# Patient Record
Sex: Male | Born: 1965 | Hispanic: Refuse to answer | Marital: Married | Smoking: Never smoker
Health system: Southern US, Community
[De-identification: ages and names within clinical notes are randomized; demographics above are authoritative.]

## PROBLEM LIST (undated history)

## (undated) DIAGNOSIS — S8290XA Unspecified fracture of unspecified lower leg, initial encounter for closed fracture: Secondary | ICD-10-CM

## (undated) DIAGNOSIS — IMO0002 Reserved for concepts with insufficient information to code with codable children: Secondary | ICD-10-CM

## (undated) DIAGNOSIS — F40240 Claustrophobia: Secondary | ICD-10-CM

## (undated) DIAGNOSIS — F988 Other specified behavioral and emotional disorders with onset usually occurring in childhood and adolescence: Secondary | ICD-10-CM

## (undated) DIAGNOSIS — S5290XA Unspecified fracture of unspecified forearm, initial encounter for closed fracture: Secondary | ICD-10-CM

## (undated) DIAGNOSIS — T7840XA Allergy, unspecified, initial encounter: Secondary | ICD-10-CM

## (undated) DIAGNOSIS — B019 Varicella without complication: Secondary | ICD-10-CM

## (undated) DIAGNOSIS — F32A Depression, unspecified: Secondary | ICD-10-CM

## (undated) DIAGNOSIS — F329 Major depressive disorder, single episode, unspecified: Secondary | ICD-10-CM

## (undated) HISTORY — DX: Reserved for concepts with insufficient information to code with codable children: IMO0002

## (undated) HISTORY — PX: INGUINAL HERNIA REPAIR: SUR1180

## (undated) HISTORY — DX: Unspecified fracture of unspecified forearm, initial encounter for closed fracture: S52.90XA

## (undated) HISTORY — DX: Unspecified fracture of unspecified lower leg, initial encounter for closed fracture: S82.90XA

## (undated) HISTORY — PX: TOOTH EXTRACTION: SUR596

## (undated) HISTORY — DX: Claustrophobia: F40.240

## (undated) HISTORY — DX: Allergy, unspecified, initial encounter: T78.40XA

## (undated) HISTORY — DX: Depression, unspecified: F32.A

## (undated) HISTORY — DX: Varicella without complication: B01.9

## (undated) HISTORY — DX: Major depressive disorder, single episode, unspecified: F32.9

## (undated) HISTORY — DX: Other specified behavioral and emotional disorders with onset usually occurring in childhood and adolescence: F98.8

---
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
## 2014-08-15 ENCOUNTER — Encounter: Payer: Self-pay | Admitting: Internal Medicine

## 2014-08-15 ENCOUNTER — Encounter (INDEPENDENT_AMBULATORY_CARE_PROVIDER_SITE_OTHER): Payer: Self-pay

## 2014-08-15 ENCOUNTER — Ambulatory Visit (INDEPENDENT_AMBULATORY_CARE_PROVIDER_SITE_OTHER): Payer: 59 | Admitting: Internal Medicine

## 2014-08-15 VITALS — BP 118/84 | HR 87 | Temp 98.4°F | Ht 68.66 in | Wt 177.0 lb

## 2014-08-15 DIAGNOSIS — F419 Anxiety disorder, unspecified: Secondary | ICD-10-CM | POA: Insufficient documentation

## 2014-08-15 DIAGNOSIS — F329 Major depressive disorder, single episode, unspecified: Secondary | ICD-10-CM

## 2014-08-15 DIAGNOSIS — R41840 Attention and concentration deficit: Secondary | ICD-10-CM

## 2014-08-15 DIAGNOSIS — F3289 Other specified depressive episodes: Secondary | ICD-10-CM

## 2014-08-15 DIAGNOSIS — F32A Depression, unspecified: Secondary | ICD-10-CM

## 2014-08-15 DIAGNOSIS — G4700 Insomnia, unspecified: Secondary | ICD-10-CM

## 2014-08-15 DIAGNOSIS — N529 Male erectile dysfunction, unspecified: Secondary | ICD-10-CM

## 2014-08-15 NOTE — Patient Instructions (Addendum)

## 2014-08-15 NOTE — Progress Notes (Signed)
HPI Pt presents to the clinic today to establish care. He recently moved from [facility] with his partner. He does have some concerns today about ED. This has just started over the last 1-2 months. He has never had issues with this is the past. He has no history of low testosterone or prostate issues. He is not being treated for diabetes or HTN. He thinks it may be related to stress from the move. He has not tried anything OTC.  Flu: never Tetanus: < 10 years Eye Doctor: as needed Dentist: biannually  Past Medical History  Diagnosis Date  . Chicken pox   . Depression   . Allergy     Current Outpatient Prescriptions  Medication Sig Dispense Refill  . ALPRAZolam (XANAX) 0.5 MG tablet Take 0.025 mg by mouth 2 (two) times daily as needed for anxiety.      Marland Kitchen amphetamine-dextroamphetamine (ADDERALL) 20 MG tablet Take 20 mg by mouth daily.      Marland Kitchen zolpidem (AMBIEN) 10 MG tablet Take 10 mg by mouth at bedtime as needed for sleep.       No current facility-administered medications for this visit.    Allergies  Allergen Reactions  . Penicillins Shortness Of Breath    Family History  Problem Relation Age of Onset  . Asthma Mother   . Migraines Mother   . Cancer Father     Prostate  . Epilepsy Father   . Cancer Paternal Grandmother     Colon  . Stroke Paternal Grandmother   . Heart disease Paternal Grandfather     History   Social History  . Marital Status: Single    Spouse Name: N/A    Number of Children: N/A  . Years of Education: N/A   Occupational History  . Not on file.   Social History Main Topics  . Smoking status: Never Smoker   . Smokeless tobacco: Never Used  . Alcohol Use: 1.8 oz/week    3 Glasses of wine per week     Comment: occasional  . Drug Use: No  . Sexual Activity: Yes   Other Topics Concern  . Not on file   Social History Narrative  . No narrative on file    ROS:  Constitutional: Denies fever, malaise, fatigue, headache or abrupt weight changes.   Respiratory: Denies difficulty breathing, shortness of breath, cough or sputum production.   Cardiovascular: Denies chest pain, chest tightness, palpitations or swelling in the hands or feet.  GU: Denies frequency, urgency, pain with urination, blood in urine, odor or discharge. Neurological: Denies dizziness, difficulty with memory, difficulty with speech or problems with balance and coordination.   No other specific complaints in a complete review of systems (except as listed in HPI above).  PE:  BP 118/84  Pulse 87  Temp(Src) 98.4 F (36.9 C) (Oral)  Ht 5' 8.66" (1.744 m)  Wt 177 lb (80.287 kg)  BMI 26.40 kg/m2  SpO2 98% Wt Readings from Last 3 Encounters:  08/15/14 177 lb (80.287 kg)    General: Appears  His stated age, well developed, well nourished in NAD. Cardiovascular: Normal rate and rhythm. S1,S2 noted.  No murmur, rubs or gallops noted.  Pulmonary/Chest: Normal effort and positive vesicular breath sounds. No respiratory distress. No wheezes, rales or ronchi noted.  Psychiatric: Mood and affect normal. Behavior is normal. Judgment and thought content normal.    Assessment and Plan:

## 2014-08-15 NOTE — Progress Notes (Signed)
Pre visit review using our clinic review tool, if applicable. No additional management support is needed unless otherwise documented below in the visit note. 

## 2014-08-15 NOTE — Assessment & Plan Note (Signed)
Has not been diagnosed with ADD Has noted improvement with Adderall No need to request records at this time

## 2014-08-15 NOTE — Assessment & Plan Note (Signed)
Likely stress related Discussed possibility of checking testosterone Discussed treatment with medication He prefers to hold off for now and continue to monitor symptoms

## 2014-08-15 NOTE — Assessment & Plan Note (Signed)
Rare xanax use.  

## 2014-08-15 NOTE — Assessment & Plan Note (Signed)
R/T altering work schedule [employer] when needed

## 2014-09-23 ENCOUNTER — Other Ambulatory Visit: Payer: Self-pay

## 2014-09-23 NOTE — Telephone Encounter (Signed)
Pt left v/m requesting rx for adderall and alprazolam. Call when ready for pick up. Not sure if Nicki Reaper NP has written before.Please advise.

## 2014-09-24 MED ORDER — ALPRAZOLAM 0.5 MG PO TABS: 0.0250 mg | ORAL_TABLET | Freq: Two times a day (BID) | ORAL | Status: DC | PRN

## 2014-09-24 NOTE — Telephone Encounter (Signed)
Spoke to pt and advised per Dr Dayton Martes. Pt verbally expressed understanding; Xanax called in to requested pharmacy

## 2014-09-24 NOTE — Telephone Encounter (Signed)
Pt called to check on the status of this request  [phone]

## 2014-09-24 NOTE — Telephone Encounter (Signed)
Lm on pts vm requesting a call back 

## 2014-09-24 NOTE — Telephone Encounter (Signed)
Reviewed request again- has never been prescribed by [facility] and has not been diagnosed with ADD.  I am not comfortable with refilling his adderall.  Ok to refill alprazolam but adderall will have to wait until Rene Kocher returns from vacation.

## 2014-09-24 NOTE — Telephone Encounter (Signed)
It looks like he has not been diagnosed with ADD but she felt refill was appropriate.  Ok to refill both one time only.

## 2014-09-30 ENCOUNTER — Other Ambulatory Visit: Payer: Self-pay

## 2014-09-30 NOTE — Telephone Encounter (Signed)
Pt left v/m requesting rx for Adderall. Call when ready for pick up.  

## 2014-10-01 MED ORDER — AMPHETAMINE-DEXTROAMPHETAMINE 20 MG PO TABS: 20.0000 mg | ORAL_TABLET | Freq: Every day | ORAL | Status: DC

## 2014-10-01 NOTE — Telephone Encounter (Signed)
RX printed and signed and given to Melanie 

## 2014-10-02 NOTE — Telephone Encounter (Signed)
Pt left v/m requesting status of Adderall rx. Pt request cb.

## 2014-10-03 NOTE — Telephone Encounter (Signed)
Left detailed msg on VM per HIPAA letting pt know Rx is ready for pick up 

## 2014-10-22 ENCOUNTER — Other Ambulatory Visit: Payer: Self-pay | Admitting: Internal Medicine

## 2014-10-23 NOTE — Telephone Encounter (Signed)
Rx called in to pharmacy. 

## 2014-10-23 NOTE — Telephone Encounter (Signed)
Pt left v/m requesting cb with status of ambien refill; [facility] said [facility]14.

## 2014-10-23 NOTE — Telephone Encounter (Signed)
This is the first request I have seen. Ok to phone in [place]

## 2014-10-25 ENCOUNTER — Other Ambulatory Visit: Payer: Self-pay | Admitting: Internal Medicine

## 2014-10-25 NOTE — Telephone Encounter (Signed)
Last filled 09/24/2014--please advise

## 2014-10-27 NOTE — Telephone Encounter (Signed)
I want PCP input on this.  Thanks.

## 2014-10-28 NOTE — Telephone Encounter (Signed)
Rx called in to pharmacy. 

## 2014-10-28 NOTE — Telephone Encounter (Signed)
Ok to phone in xanax 

## 2014-11-21 ENCOUNTER — Other Ambulatory Visit: Payer: Self-pay

## 2014-11-21 MED ORDER — AMPHETAMINE-DEXTROAMPHETAMINE 20 MG PO TABS: 20.0000 mg | ORAL_TABLET | Freq: Every day | ORAL | Status: DC

## 2014-11-21 NOTE — Telephone Encounter (Signed)
RX printed and signed and given to Mel

## 2014-11-21 NOTE — Telephone Encounter (Signed)
Pt left v/m requesting rx for Adderall. Call when ready for pick up. Pt last seen 08/15/14.

## 2014-11-22 NOTE — Telephone Encounter (Signed)
Rx left in front office for pick up and left detailed msg on VM per HIPAA

## 2014-12-10 ENCOUNTER — Other Ambulatory Visit: Payer: Self-pay | Admitting: Internal Medicine

## 2014-12-11 NOTE — Telephone Encounter (Signed)
Ok to fill. Will need  CSA and UDS.

## 2014-12-11 NOTE — Telephone Encounter (Signed)
Last filled 10/28/14--please advise

## 2014-12-13 NOTE — Telephone Encounter (Signed)
Called pt left detailed msg per HIPAA  requesting him to come in to sign controlled substance contract before i can call in the Xanax--i asked pt if he could stop by the desk upon completion of CSA to let me know so that i can call in Rx

## 2014-12-16 ENCOUNTER — Encounter: Payer: Self-pay | Admitting: Internal Medicine

## 2014-12-16 ENCOUNTER — Telehealth: Payer: Self-pay | Admitting: Internal Medicine

## 2014-12-16 MED ORDER — ALPRAZOLAM 0.25 MG PO TABS: ORAL_TABLET | ORAL | Status: DC

## 2014-12-16 NOTE — Telephone Encounter (Signed)
Rx called in to pharmacy. 

## 2014-12-16 NOTE — Telephone Encounter (Signed)
Pt has completed his controlled substance contract and has left a sample in the lab. Pt said that there was a prescription that needed to be called in once this is complete.

## 2014-12-16 NOTE — Telephone Encounter (Signed)
Pt completed UDS---Rx called into pharmacy

## 2014-12-16 NOTE — Addendum Note (Signed)
Addended by: Roena Malady on: 12/16/2014 04:39 PM   Modules accepted: Orders

## 2014-12-30 ENCOUNTER — Other Ambulatory Visit: Payer: Self-pay | Admitting: *Deleted

## 2014-12-30 MED ORDER — AMPHETAMINE-DEXTROAMPHETAMINE 20 MG PO TABS: 20.0000 mg | ORAL_TABLET | Freq: Every day | ORAL | Status: DC

## 2014-12-30 NOTE — Telephone Encounter (Signed)
RX printed and signed, put in [facility] inbox

## 2014-12-30 NOTE — Telephone Encounter (Signed)
Pt left message on vm for a refill of adderall. He also said that his pharmacy states our office never responded to their request for alprazolam.

## 2014-12-31 ENCOUNTER — Encounter: Payer: Self-pay | Admitting: Internal Medicine

## 2014-12-31 MED ORDER — ALPRAZOLAM 0.25 MG PO TABS: ORAL_TABLET | ORAL | Status: DC

## 2014-12-31 NOTE — Telephone Encounter (Signed)
Pt is aware of Rx pick up--however pt [facility]--was documented Rx called in 12/16/14--[facility] 11/15--called in Xanax

## 2014-12-31 NOTE — Addendum Note (Signed)
Addended by: Roena Malady on: 12/31/2014 04:53 PM   Modules accepted: Orders

## 2015-01-14 ENCOUNTER — Other Ambulatory Visit: Payer: Self-pay | Admitting: Internal Medicine

## 2015-01-14 ENCOUNTER — Encounter: Payer: 59 | Admitting: Internal Medicine

## 2015-01-21 ENCOUNTER — Encounter: Payer: Self-pay | Admitting: Family Medicine

## 2015-01-21 ENCOUNTER — Ambulatory Visit (INDEPENDENT_AMBULATORY_CARE_PROVIDER_SITE_OTHER): Payer: 59 | Admitting: Family Medicine

## 2015-01-21 VITALS — BP 104/84 | HR 87 | Temp 98.9°F | Wt 185.2 lb

## 2015-01-21 DIAGNOSIS — R41840 Attention and concentration deficit: Secondary | ICD-10-CM

## 2015-01-21 DIAGNOSIS — Z7189 Other specified counseling: Secondary | ICD-10-CM

## 2015-01-21 DIAGNOSIS — Z0000 Encounter for general adult medical examination without abnormal findings: Secondary | ICD-10-CM

## 2015-01-21 DIAGNOSIS — Z1211 Encounter for screening for malignant neoplasm of colon: Secondary | ICD-10-CM

## 2015-01-21 DIAGNOSIS — Z125 Encounter for screening for malignant neoplasm of prostate: Secondary | ICD-10-CM

## 2015-01-21 DIAGNOSIS — G4700 Insomnia, unspecified: Secondary | ICD-10-CM

## 2015-01-21 LAB — PSA: PSA: 0.84 ng/mL (ref 0.10–4.00)

## 2015-01-21 MED ORDER — AMPHETAMINE-DEXTROAMPHETAMINE 20 MG PO TABS: 20.0000 mg | ORAL_TABLET | Freq: Every day | ORAL | Status: DC

## 2015-01-21 NOTE — Progress Notes (Signed)
Pre visit review using our clinic review tool, if applicable. No additional management support is needed unless otherwise documented below in the visit note.  CPE- See plan.  Routine anticipatory guidance given to patient.  See health maintenance. Tetanus likely up to date, requesting records.  Flu shot d/w pt.  PSA pending, FH noted.  D/w patient [id number] for colon cancer screening, including IFOB vs. colonoscopy.  Risks and benefits of both were discussed and patient voiced understanding.  Pt elects QQP:YPPJ.  Living will- husband [name] designated if patient were incapacitated.  Diet and exercise d/w pt, encouraged.  He is looking for work.    ADD.  H/o low mood (after end of prev relationship) and concentration difficulty, prev rx'd adderall by prior MD.  Good effect on med.  No ADE.  Compliant.  No SI/HI.   H/o insomnia and h/o claustrophobia.  Taking xanax prn, not daily.  No ADE on med.  +effect when used prn.    PMH and SH reviewed  Meds, vitals, and allergies reviewed.   ROS: See HPI.  Otherwise negative.    GEN: nad, alert and oriented HEENT: mucous membranes moist NECK: supple w/o LA CV: rrr. PULM: ctab, no inc wob ABD: soft, +bs EXT: no edema SKIN: no acute rash

## 2015-01-21 NOTE — Patient Instructions (Signed)
Go to the lab on the way out.  We'll contact you with your lab report. I'll get your old records in the meantime.  Take care.  Glad to see you.

## 2015-01-23 ENCOUNTER — Encounter: Payer: Self-pay | Admitting: Family Medicine

## 2015-01-23 DIAGNOSIS — Z7189 Other specified counseling: Secondary | ICD-10-CM | POA: Insufficient documentation

## 2015-01-23 DIAGNOSIS — Z0000 Encounter for general adult medical examination without abnormal findings: Secondary | ICD-10-CM | POA: Insufficient documentation

## 2015-01-23 NOTE — Assessment & Plan Note (Signed)
Routine anticipatory guidance given to patient.  See health maintenance. Tetanus likely up to date, requesting records.  Flu shot d/w pt.  PSA pending, FH noted.  D/w patient [id number] for colon cancer screening, including IFOB vs. colonoscopy.  Risks and benefits of both were discussed and patient voiced understanding.  Pt elects IAX:KPVV.  Living will- husband [name] designated if patient were incapacitated.  Diet and exercise d/w pt, encouraged.  He is looking for work.    ADD.  H/o low mood (after end of prev relationship) and concentration difficulty, prev rx'd adderall by prior MD.  Good effect on med.  No ADE.  Compliant.  No SI/HI.   H/o insomnia and h/o claustrophobia.  Taking xanax prn, not daily.  No ADE on med.  +effect when used prn.

## 2015-01-23 NOTE — Assessment & Plan Note (Signed)
Likely ADD.  H/o low mood (after end of prev relationship) and concentration difficulty, prev rx'd adderall by prior MD.  Good effect on med.  No ADE.  Compliant.  No SI/HI. Continue as is for now.

## 2015-01-23 NOTE — Assessment & Plan Note (Addendum)
H/o insomnia and h/o claustrophobia.  Taking xanax prn, not daily.  No ADE on med.  +effect when used prn.  Okay to continue prn.

## 2015-02-03 ENCOUNTER — Other Ambulatory Visit: Payer: Self-pay | Admitting: Internal Medicine

## 2015-02-03 NOTE — Telephone Encounter (Signed)
Changed to me.  Please change the EMR labelling.   Please call in.  Thanks.  (Routed to [place] as a FYI)

## 2015-02-03 NOTE — Telephone Encounter (Signed)
Dr. Para March, Where you taking over his care, or just doing his physical exam?

## 2015-02-03 NOTE — Telephone Encounter (Signed)
Last filled 12/31/14--please advise

## 2015-02-04 NOTE — Telephone Encounter (Signed)
Medication phoned to pharmacy.  

## 2015-02-10 ENCOUNTER — Encounter: Payer: Self-pay | Admitting: Family Medicine

## 2015-02-10 DIAGNOSIS — Z8042 Family history of malignant neoplasm of prostate: Secondary | ICD-10-CM | POA: Insufficient documentation

## 2015-02-10 LAB — GLUCOSE (CC13)
ALT: 21 U/L (ref 10–40)
AST: 19 U/L
CHOLESTEROL, TOTAL: 186
Creatinine, Ser: 0.96
Glucose: 96
HDL: 58 mg/dL (ref 35–70)
LDL (calc): 101
PSA: 0.8
TRIGLYCERIDES: 134
TSH: 1.7

## 2015-02-19 ENCOUNTER — Telehealth: Payer: Self-pay | Admitting: Family Medicine

## 2015-02-19 ENCOUNTER — Other Ambulatory Visit: Payer: Self-pay

## 2015-02-19 MED ORDER — AMPHETAMINE-DEXTROAMPHETAMINE 20 MG PO TABS: 20.0000 mg | ORAL_TABLET | Freq: Every day | ORAL | Status: DC

## 2015-02-19 NOTE — Telephone Encounter (Signed)
Printed.  Thanks.  

## 2015-02-19 NOTE — Telephone Encounter (Signed)
Pt left v/m requesting rx for Adderall. Call when ready for pick up. Pt last seen 01/21/15.

## 2015-02-19 NOTE — Telephone Encounter (Signed)
Pt left v/m requesting cb about billing question for EOB received from ins co. Left detailed v/m per DPR to call [facility] at [phone].

## 2015-02-19 NOTE — Telephone Encounter (Signed)
Patient notified that script is up front ready for pickup. 

## 2015-02-20 NOTE — Telephone Encounter (Signed)
Pt left v/m requesting cb about EOB received for $945 for drug testing. Left v/m per DPR advising pt is he got a bill from [facility] pt can call [facility] Dopp at [phone] or if further questions can call [facility].

## 2015-02-25 ENCOUNTER — Telehealth: Payer: Self-pay | Admitting: Family Medicine

## 2015-02-25 NOTE — Telephone Encounter (Signed)
Pt came in to get rx.  He was checking on his labs results that was done 01/21/15 for cpx. He stated he received letter for psa, but the labs were to be complete work up for cpx. Please advise

## 2015-02-27 NOTE — Telephone Encounter (Signed)
He is less than 1 year out from last set of labs.  TSH wnl prev, not on thyroid med, doesn't need routine follow up.  Lipids prev okay, doesn't need recheck as it wouldn't change plan- rec healthy diet and exercise.  Can recheck lipids in a few years unless he has indication sooner (ie HTN, DM2, CVA etc).  Wouldn't need routine CMP or CBC at this point, he doesn't have an indication.  Only test that was due was PSA; done, normal. No other testing due.  No other labs ordered.  Thanks.

## 2015-02-27 NOTE — Telephone Encounter (Signed)
Pt states he thought he was going to get full CPE panel--lipids, cmp, cbc, tsh etc---I did look at labs scanned into chart and it has 01/2015 as the dated but pt states he has not had blood work since 2015--pt wants to know if he can come back for labs only appt and you can order addit. tests for CPE--please call back pt when ordered

## 2015-03-02 ENCOUNTER — Other Ambulatory Visit: Payer: Self-pay | Admitting: Family Medicine

## 2015-03-03 NOTE — Telephone Encounter (Signed)
Medication phoned to pharmacy.  

## 2015-03-03 NOTE — Telephone Encounter (Signed)
If he has a change in a skin lesion, then notify us.  Baseline EKG if indicated (HTN, PVD, age>65, etc) or if symptoms direct w/u (ie CP).  As far as I know, he has no indication.  The USPSTF (preventive services task force )has reaffirmed its previous recommendation statement discouraging screening for testicular cancer with clinical or patient self-examination in adolescent and adult males.  It isn't needed unless he notes a change.  All of this is by guidelines, none of it is by my opinion.   I cannot comment on his other docs.  Guidelines do periodically change as do patient conditions, directing eval and treatment at that point.

## 2015-03-03 NOTE — Telephone Encounter (Signed)
Please call in.  Thanks.   

## 2015-03-03 NOTE — Telephone Encounter (Signed)
Patient advised.   Patient then asks what about a check for skin lesions, testicular exam and EKG?  I explained that we usually don't do EKG's unless indicated, especially at his age.  He states that this is a very different protocol than what he has been used to every year at his previous MD office but thanks Dr. Para March for the information.

## 2015-03-03 NOTE — Telephone Encounter (Signed)
Electronic refill request. Last Filled:    30 tablet 0 02/03/2015  Please advise.

## 2015-03-25 ENCOUNTER — Other Ambulatory Visit: Payer: Self-pay | Admitting: Internal Medicine

## 2015-03-25 NOTE — Telephone Encounter (Signed)
Last filled 10/23/14--please advise 

## 2015-03-25 NOTE — Telephone Encounter (Signed)
Will defer to Dr. Para March as he is now PCP

## 2015-03-26 NOTE — Telephone Encounter (Signed)
Spoke to patient and was advised that he did request the refill. Patient stated that he has started a new job and has to get up early in the morning and would like to have Ambien on hand to use if he can not go to sleep. Patient stated that he is taking Xanax only as needed. Patient stated that he will only take Ambien as needed.

## 2015-03-26 NOTE — Telephone Encounter (Signed)
Clarify with patient.  He was on xanax.  I thought he Mark Simon was dc'd.  Thanks.

## 2015-03-27 NOTE — Telephone Encounter (Signed)
Please call in.  Thanks.   

## 2015-03-28 NOTE — Telephone Encounter (Signed)
[facility]ed [facility]. 

## 2015-04-02 ENCOUNTER — Other Ambulatory Visit: Payer: Self-pay | Admitting: *Deleted

## 2015-04-02 MED ORDER — AMPHETAMINE-DEXTROAMPHETAMINE 20 MG PO TABS: 20.0000 mg | ORAL_TABLET | Freq: Every day | ORAL | Status: DC

## 2015-04-02 NOTE — Telephone Encounter (Signed)
Called patient to advise that script is ready for pickup. Patient stated that if you will look at his previous records he was taking this medication two times a day and would like to go back to that dose. Patient stated that one a day is not working well for him.

## 2015-04-02 NOTE — Telephone Encounter (Signed)
Printed, 2 rxs.  Thanks.

## 2015-04-02 NOTE — Telephone Encounter (Signed)
CPE 01/21/15.  Last filled #30 02/19/15.  Patient is requesting a 2 or 3 month supply.

## 2015-04-03 MED ORDER — AMPHETAMINE-DEXTROAMPHETAMINE 20 MG PO TABS: 20.0000 mg | ORAL_TABLET | Freq: Every day | ORAL | Status: DC

## 2015-04-03 MED ORDER — AMPHETAMINE-DEXTROAMPHETAMINE 20 MG PO TABS: 20.0000 mg | ORAL_TABLET | Freq: Two times a day (BID) | ORAL | Status: DC

## 2015-04-03 NOTE — Telephone Encounter (Signed)
rx changed, printed.  Please shred old rxs. Thanks. UDS on rx pick up.

## 2015-04-03 NOTE — Telephone Encounter (Signed)
Patient advised that Rx is ready and a picture ID and UDS will be done at pick up. Rx left at front desk for pick up.

## 2015-04-04 ENCOUNTER — Encounter: Payer: Self-pay | Admitting: Family Medicine

## 2015-04-13 ENCOUNTER — Other Ambulatory Visit: Payer: Self-pay | Admitting: Family Medicine

## 2015-04-21 ENCOUNTER — Encounter: Payer: Self-pay | Admitting: Family Medicine

## 2015-04-21 ENCOUNTER — Ambulatory Visit (INDEPENDENT_AMBULATORY_CARE_PROVIDER_SITE_OTHER): Payer: 59 | Admitting: Family Medicine

## 2015-04-21 VITALS — BP 122/80 | HR 88 | Temp 98.8°F | Wt 188.5 lb

## 2015-04-21 DIAGNOSIS — F329 Major depressive disorder, single episode, unspecified: Secondary | ICD-10-CM | POA: Diagnosis not present

## 2015-04-21 DIAGNOSIS — J069 Acute upper respiratory infection, unspecified: Secondary | ICD-10-CM | POA: Diagnosis not present

## 2015-04-21 DIAGNOSIS — F32A Depression, unspecified: Secondary | ICD-10-CM

## 2015-04-21 MED ORDER — BENZONATATE 200 MG PO CAPS: 200.0000 mg | ORAL_CAPSULE | Freq: Three times a day (TID) | ORAL | Status: DC | PRN

## 2015-04-21 MED ORDER — ALPRAZOLAM 0.25 MG PO TABS: ORAL_TABLET | ORAL | Status: DC

## 2015-04-21 NOTE — Progress Notes (Signed)
Pre visit review using our clinic review tool, if applicable. No additional management support is needed unless otherwise documented below in the visit note.  Rare ambien use.  UDS appropriate.  D/w pt.    Anxiety.  Improved recently.  Has stopped xanax use.  D/w pt about neg UDS.  If not needing med now, then he shouldn't need refill in the meantime.  He understands.  From this point on, if needing med frequently, then he would likely have UDS pos.   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    Recent URI sx.  Started about 5 days ago.  Started with dry cough.  No sputum.  Worse at night.  The last 2-3 days, with more facial pressure and R ear ache.  Ears feel clogged "with a humming in the ears". He has felt hot and cold, alternating.   Some chills. Minimal rhinorrhea.  Some ST.   Occ vomiting, occ diarrhea, in the last few days, neither bloody.  No sick contacts.  No rash.  He usually doesn't have GI sx with a head cold.  Possible sick contacts, was visiting at [facility] last week.  Meds, vitals, and allergies reviewed.   ROS: See HPI.  Otherwise, noncontributory.  GEN: nad, alert and oriented HEENT: mucous membranes moist, tm w/o erythema, nasal exam w/o erythema, clear discharge noted,  OP with cobblestoning NECK: supple w/o LA CV: rrr.   PULM: ctab, no inc wob EXT: no edema SKIN: no acute rash

## 2015-04-21 NOTE — Patient Instructions (Signed)
Drink plenty of fluids, take tylenol as needed, and gargle with warm salt water for your throat.  Use nasal saline.  This should gradually improve.  Take care.  Let us know if you have other concerns.  Out of work for now.

## 2015-04-23 DIAGNOSIS — J069 Acute upper respiratory infection, unspecified: Secondary | ICD-10-CM | POA: Insufficient documentation

## 2015-04-23 NOTE — Assessment & Plan Note (Signed)
And anxiety.  Improved recently.  Has stopped xanax use.  D/w pt about neg UDS.  If not needing med now, then he shouldn't need refill in the meantime, at least not soon.  He understands.  From this point on, if needing med frequently, then he would likely have UDS pos.

## 2015-04-23 NOTE — Assessment & Plan Note (Signed)
Nontoxic, likely viral, supportive tx.  See AVS.  D/w pt.

## 2015-04-25 ENCOUNTER — Telehealth: Payer: Self-pay | Admitting: Family Medicine

## 2015-04-25 MED ORDER — AZITHROMYCIN 250 MG PO TABS: ORAL_TABLET | ORAL | Status: DC

## 2015-04-25 NOTE — Telephone Encounter (Signed)
1 week sx, with purulent sx. Would start zmax, continue baseline meds.  rx sent.  Thanks.

## 2015-04-25 NOTE — Telephone Encounter (Signed)
Left detailed msg on VM per HIPAA  

## 2015-04-25 NOTE — Telephone Encounter (Signed)
Pt called stating he saw dr Para March on Monday for sore throat and cough.  Pt stated his throat is still very sore and he is cough worse.  He stated he is coughing up stuff now greenish yellow.  He wants to know what else he is doing.  He is taking the rx that dr Para March gave him monday  [facility] [place]

## 2015-05-13 ENCOUNTER — Encounter: Payer: Self-pay | Admitting: Family Medicine

## 2015-05-30 ENCOUNTER — Telehealth: Payer: Self-pay

## 2015-05-30 NOTE — Telephone Encounter (Signed)
Pt left v/m requesting 2 rx for Adderall. Call when ready for pick up. Last rx printed # 60 on 04/03/15.last annual exam 01/21/15.

## 2015-05-31 MED ORDER — AMPHETAMINE-DEXTROAMPHETAMINE 20 MG PO TABS: 20.0000 mg | ORAL_TABLET | Freq: Every day | ORAL | Status: DC

## 2015-05-31 MED ORDER — AMPHETAMINE-DEXTROAMPHETAMINE 20 MG PO TABS: 20.0000 mg | ORAL_TABLET | Freq: Two times a day (BID) | ORAL | Status: DC

## 2015-05-31 NOTE — Telephone Encounter (Signed)
Both printed. Thanks.  

## 2015-06-02 NOTE — Telephone Encounter (Signed)
Patient notified by telephone that script is up front ready for pickup. 

## 2015-06-06 ENCOUNTER — Other Ambulatory Visit: Payer: Self-pay | Admitting: Family Medicine

## 2015-06-10 NOTE — Telephone Encounter (Signed)
Please call in.  Thanks.   

## 2015-06-10 NOTE — Telephone Encounter (Signed)
Medication phoned to pharmacy.  

## 2015-06-10 NOTE — Telephone Encounter (Signed)
Electronic refill request. Historical med from 04/21/15.  Please advise.

## 2015-06-12 NOTE — Telephone Encounter (Signed)
[facility] from [facility] received 2 rx for Adderall 20 mg; one rx has instructions one daily and the other rx has one bid.Please advise.Roe request cb with clarification.(med list has 2 rx listed with these instructions).

## 2015-06-12 NOTE — Telephone Encounter (Addendum)
It should have been #60, 1 PO BID.  He can take less if needed, or skip a day if needed.  One was correct, the other had QD dosing.  I apologize. I can reprint if needed.   Med list here updated.   Thanks.

## 2015-06-12 NOTE — Telephone Encounter (Signed)
Dr. Para March spoke directly with Roe to confirm the directions.

## 2015-06-12 NOTE — Addendum Note (Signed)
Addended by: Joaquim Nam on: 06/12/2015 01:47 PM   Modules accepted: Orders, Medications

## 2015-07-15 ENCOUNTER — Other Ambulatory Visit: Payer: Self-pay | Admitting: Family Medicine

## 2015-07-15 NOTE — Telephone Encounter (Signed)
Electronic refill request. Last Filled:   04/21/2015  , ? Quantity.  Please advise.

## 2015-07-16 NOTE — Telephone Encounter (Signed)
Rx called in as directed.   

## 2015-07-16 NOTE — Telephone Encounter (Signed)
Please call in.  Thanks.   

## 2015-08-13 ENCOUNTER — Other Ambulatory Visit: Payer: Self-pay | Admitting: Family Medicine

## 2015-08-13 NOTE — Telephone Encounter (Signed)
Pt left v/m requesting rx for Adderall. Call when ready for pick up. rx last printed #60 on 05/31/15 and last annual exam on 01/21/15.

## 2015-08-13 NOTE — Telephone Encounter (Signed)
Received refill request electronically Last refill 06/10/15 #30 Last office visit 04/21/15 Is it okay tor refill?

## 2015-08-14 MED ORDER — AMPHETAMINE-DEXTROAMPHETAMINE 20 MG PO TABS: 20.0000 mg | ORAL_TABLET | Freq: Two times a day (BID) | ORAL | Status: DC

## 2015-08-14 NOTE — Telephone Encounter (Signed)
Printed.  Thanks.  

## 2015-08-14 NOTE — Telephone Encounter (Signed)
Left voicemail notifying patient that Rx was placed up front for pick up.

## 2015-08-18 ENCOUNTER — Other Ambulatory Visit: Payer: Self-pay | Admitting: Family Medicine

## 2015-09-16 ENCOUNTER — Other Ambulatory Visit: Payer: Self-pay | Admitting: Family Medicine

## 2015-09-16 NOTE — Telephone Encounter (Signed)
Pt left v/m requesting rx for Adderall. Call when ready for pick up. rx last printed # 60 on 08/14/15. Last annual exam on 01/21/15.

## 2015-09-16 NOTE — Telephone Encounter (Signed)
Last filled #30 07/16/15 with 1 refill.

## 2015-09-17 MED ORDER — AMPHETAMINE-DEXTROAMPHETAMINE 20 MG PO TABS: 20.0000 mg | ORAL_TABLET | Freq: Two times a day (BID) | ORAL | Status: DC

## 2015-09-17 NOTE — Telephone Encounter (Signed)
Printed.  Thanks.  

## 2015-09-17 NOTE — Telephone Encounter (Signed)
Patient notified by telephone that script is up front ready for pickup. 

## 2015-09-17 NOTE — Telephone Encounter (Signed)
Please call in.  Thanks.   

## 2015-09-17 NOTE — Telephone Encounter (Signed)
Rx called to pharmacy as instructed. 

## 2015-10-15 ENCOUNTER — Other Ambulatory Visit: Payer: Self-pay | Admitting: Family Medicine

## 2015-10-15 NOTE — Telephone Encounter (Signed)
Received refill request electronically from pharmacy Last refill Adderall 09/17/15 #60 Last refill Ambien 08/14/15 #30 Last office visit 04/21/15 Drug screen due per notes

## 2015-10-16 MED ORDER — AMPHETAMINE-DEXTROAMPHETAMINE 20 MG PO TABS: 20.0000 mg | ORAL_TABLET | Freq: Two times a day (BID) | ORAL | Status: DC

## 2015-10-16 NOTE — Telephone Encounter (Signed)
Agreed.  Printed. Thanks.

## 2015-10-16 NOTE — Telephone Encounter (Signed)
Left message on machine that rx is ready for pick-up, and it will be at our front desk.  

## 2015-10-17 ENCOUNTER — Encounter: Payer: Self-pay | Admitting: Family Medicine

## 2015-11-07 ENCOUNTER — Encounter: Payer: Self-pay | Admitting: Family Medicine

## 2015-11-14 ENCOUNTER — Other Ambulatory Visit: Payer: Self-pay | Admitting: Family Medicine

## 2015-11-14 NOTE — Telephone Encounter (Signed)
Pt left v/m requesting rx for Adderall. Call when ready for pick up. Last printed # 60 on 10/16/15;last annual 01/21/15.

## 2015-11-14 NOTE — Telephone Encounter (Signed)
Electronic refill request. Last Filled:    30 tablet 1 09/17/2015  Please advise. Last office visit:   04/21/15

## 2015-11-16 MED ORDER — AMPHETAMINE-DEXTROAMPHETAMINE 20 MG PO TABS: 20.0000 mg | ORAL_TABLET | Freq: Two times a day (BID) | ORAL | Status: DC

## 2015-11-16 NOTE — Telephone Encounter (Signed)
Please call in.  Thanks.   

## 2015-11-16 NOTE — Telephone Encounter (Signed)
Printed.  Thanks.  

## 2015-11-17 NOTE — Telephone Encounter (Signed)
Medication phoned to pharmacy.  

## 2015-12-16 ENCOUNTER — Other Ambulatory Visit: Payer: Self-pay | Admitting: Family Medicine

## 2015-12-16 NOTE — Telephone Encounter (Signed)
Electronic refill request.  Last Filled:    30 tablet 0 10/16/2015  Please advise.

## 2015-12-17 NOTE — Telephone Encounter (Signed)
Rx called to pharmacy as instructed. 

## 2015-12-17 NOTE — Telephone Encounter (Signed)
Please call in.  Thanks.   

## 2015-12-18 ENCOUNTER — Other Ambulatory Visit: Payer: Self-pay

## 2015-12-18 MED ORDER — AMPHETAMINE-DEXTROAMPHETAMINE 20 MG PO TABS: 20.0000 mg | ORAL_TABLET | Freq: Two times a day (BID) | ORAL | Status: DC

## 2015-12-18 NOTE — Telephone Encounter (Signed)
Due for CPE.  Please schedule.  Printed.  Thanks.

## 2015-12-18 NOTE — Telephone Encounter (Signed)
Pt left v/m requesting rx for Adderall. Call when ready for pick up. Last printed # 60 on 11/16/15. Last annual exam on 01/21/15.

## 2015-12-18 NOTE — Telephone Encounter (Signed)
Left detailed message on voicemail. Rx left at front desk for pick up.  

## 2016-01-15 ENCOUNTER — Other Ambulatory Visit: Payer: Self-pay | Admitting: Family Medicine

## 2016-01-15 NOTE — Telephone Encounter (Signed)
Please call in.  Thanks.   

## 2016-01-15 NOTE — Telephone Encounter (Signed)
Electronic refill request. Last Filled:    30 tablet 1 11/16/2015  Last office visit:   04/21/15 but upcoming appts scheduled.  Please advise.

## 2016-01-16 NOTE — Telephone Encounter (Signed)
Rx called to pharmacy as instructed. 

## 2016-01-19 ENCOUNTER — Other Ambulatory Visit: Payer: Self-pay

## 2016-01-19 ENCOUNTER — Other Ambulatory Visit: Payer: Self-pay | Admitting: Family Medicine

## 2016-01-19 DIAGNOSIS — F329 Major depressive disorder, single episode, unspecified: Secondary | ICD-10-CM

## 2016-01-19 DIAGNOSIS — R41840 Attention and concentration deficit: Secondary | ICD-10-CM

## 2016-01-19 DIAGNOSIS — F32A Depression, unspecified: Secondary | ICD-10-CM

## 2016-01-19 DIAGNOSIS — Z125 Encounter for screening for malignant neoplasm of prostate: Secondary | ICD-10-CM

## 2016-01-19 NOTE — Telephone Encounter (Signed)
Pt left v/m requesting rx for Adderall. Call when ready for pick up. rx last printed # 60 on 12/18/15; last seen annual exam 01/21/15. Pt has lab appt on 01/21/16 for CPX labs and request to pick up rx at that time.

## 2016-01-20 MED ORDER — AMPHETAMINE-DEXTROAMPHETAMINE 20 MG PO TABS: 20.0000 mg | ORAL_TABLET | Freq: Two times a day (BID) | ORAL | Status: DC

## 2016-01-20 NOTE — Telephone Encounter (Signed)
Printed.  Thanks.  

## 2016-01-20 NOTE — Telephone Encounter (Signed)
Left detailed message on voicemail. Rx left at front desk for pick up.  

## 2016-01-21 ENCOUNTER — Other Ambulatory Visit (INDEPENDENT_AMBULATORY_CARE_PROVIDER_SITE_OTHER): Payer: 59

## 2016-01-21 DIAGNOSIS — R41840 Attention and concentration deficit: Secondary | ICD-10-CM | POA: Diagnosis not present

## 2016-01-21 DIAGNOSIS — Z125 Encounter for screening for malignant neoplasm of prostate: Secondary | ICD-10-CM | POA: Diagnosis not present

## 2016-01-21 LAB — BASIC METABOLIC PANEL
BUN: 10 mg/dL (ref 6–23)
CALCIUM: 9.4 mg/dL (ref 8.4–10.5)
CO2: 28 meq/L (ref 19–32)
CREATININE: 0.88 mg/dL (ref 0.40–1.50)
Chloride: 102 mEq/L (ref 96–112)
GFR: 96.95 mL/min (ref >60.00)
GLUCOSE: 103 mg/dL — AB (ref 70–99)
Potassium: 4.4 mEq/L (ref 3.5–5.1)
SODIUM: 137 meq/L (ref 135–145)

## 2016-01-21 LAB — PSA: PSA: 0.74 ng/mL (ref 0.10–4.00)

## 2016-02-04 ENCOUNTER — Ambulatory Visit (INDEPENDENT_AMBULATORY_CARE_PROVIDER_SITE_OTHER): Payer: 59 | Admitting: Family Medicine

## 2016-02-04 ENCOUNTER — Encounter: Payer: Self-pay | Admitting: Family Medicine

## 2016-02-04 VITALS — BP 110/78 | HR 96 | Temp 98.5°F | Ht 69.0 in | Wt 183.5 lb

## 2016-02-04 DIAGNOSIS — F329 Major depressive disorder, single episode, unspecified: Secondary | ICD-10-CM

## 2016-02-04 DIAGNOSIS — Z119 Encounter for screening for infectious and parasitic diseases, unspecified: Secondary | ICD-10-CM

## 2016-02-04 DIAGNOSIS — Z23 Encounter for immunization: Secondary | ICD-10-CM | POA: Diagnosis not present

## 2016-02-04 DIAGNOSIS — Z1211 Encounter for screening for malignant neoplasm of colon: Secondary | ICD-10-CM

## 2016-02-04 DIAGNOSIS — F32A Depression, unspecified: Secondary | ICD-10-CM

## 2016-02-04 DIAGNOSIS — Z0000 Encounter for general adult medical examination without abnormal findings: Secondary | ICD-10-CM

## 2016-02-04 DIAGNOSIS — R41840 Attention and concentration deficit: Secondary | ICD-10-CM

## 2016-02-04 NOTE — Progress Notes (Signed)
Pre visit review using our clinic review tool, if applicable. No additional management support is needed unless otherwise documented below in the visit note.  CPE- See plan.  Routine anticipatory guidance given to patient.  See health maintenance. PSA wnl.  D/w patient IO:XBDZHGD for colon cancer screening, including IFOB vs. colonoscopy.  Risks and benefits of both were discussed and patient voiced understanding.  Pt elects JME:[id number].   Sugar minimally up.  D/w pt about low carb diet.   Tetanus likely up to date.   Flu today.   PNA not due.  Shingles not due.   Living will d/w pt.  [name] designated if patient were incapacitated.   Diet and exercise d/w pt.  Encouraged.   HIV neg prev per patient report, done in 2013.  Pt opts in for HCV screening.  D/w pt re: routine screening.    Sleep is still disrupted.  Insurance is only covering 15 ambien per month.  Going to be at 8:30, getting up at [facility].  He has good sleep hygiene.  He isn't taking adderall late in the day.  He has trouble with "turning his mind off" at night.  He has tried tea, avoiding electronics.  His partner likes to eat bigger dinners and that may be playing a role.  His work schedule plays a role, having to get up early but that isn't an easily changed issue.  D/w pt about exercise.    Mood is good.  Concentration is mixed.  He is having to multitask at work, without "major malfunctions".    PMH and SH reviewed  Meds, vitals, and allergies reviewed.   ROS: See HPI.  Otherwise negative.    GEN: nad, alert and oriented HEENT: mucous membranes moist NECK: supple w/o LA CV: rrr. PULM: ctab, no inc wob ABD: soft, +bs EXT: no edema SKIN: no acute rash

## 2016-02-04 NOTE — Patient Instructions (Addendum)
Shirlee Limerick will call about your referral. If you have had a tetanus shot in the last 10 years, then you are up to date.   Increase your amount of exercise.  See if that helps with sleep and concentration.   Take care.  Glad to see you.

## 2016-02-05 ENCOUNTER — Other Ambulatory Visit: Payer: Self-pay

## 2016-02-05 ENCOUNTER — Telehealth: Payer: Self-pay

## 2016-02-05 NOTE — Assessment & Plan Note (Signed)
Continue current meds.  No ADE.  Would work on getting more exercise as that may help.  He agrees.  Okay for outpatient f/u.

## 2016-02-05 NOTE — Assessment & Plan Note (Signed)
Continue current meds.  No ADE.  Would work on getting more exercise as that may help.  He agrees.

## 2016-02-05 NOTE — Telephone Encounter (Signed)
Gastroenterology Pre-Procedure Review  Request Date: 03/08/16 Requesting Physician: Dr. Para March  PATIENT REVIEW QUESTIONS: The patient responded to the following health history questions as indicated:    1. Are you having any GI issues? no 2. Do you have a personal history of Polyps? no 3. Do you have a family history of Colon Cancer or Polyps? no 4. Diabetes Mellitus? no 5. Joint replacements in the past 12 months?no 6. Major health problems in the past 3 months?no 7. Any artificial heart valves, MVP, or defibrillator?no    MEDICATIONS & ALLERGIES:    Patient reports the following regarding taking any anticoagulation/antiplatelet therapy:   Plavix, Coumadin, Eliquis, Xarelto, Lovenox, Pradaxa, Brilinta, or Effient? no Aspirin? no  Patient confirms/reports the following medications:  Current Outpatient Prescriptions  Medication Sig Dispense Refill  . ALPRAZolam (XANAX) 0.25 MG tablet TAKE 1/2 TABLET BY MOUTH TWICE A DAY AS NEEDED 30 tablet 1  . amphetamine-dextroamphetamine (ADDERALL) 20 MG tablet Take 1 tablet (20 mg total) by mouth 2 (two) times daily. 60 tablet 0  . zolpidem (AMBIEN) 10 MG tablet TAKE 1 TABLET BY MOUTH AT BEDTIME 30 tablet 0   No current facility-administered medications for this visit.    Patient confirms/reports the following allergies:  Allergies  Allergen Reactions  . Penicillins Shortness Of Breath    No orders of the defined types were placed in this encounter.    AUTHORIZATION INFORMATION Primary Insurance: 1D#: Group #:  Secondary Insurance: 1D#: Group #:  SCHEDULE INFORMATION: Date: 03/08/16 Time: Location: [facility]

## 2016-02-05 NOTE — Assessment & Plan Note (Signed)
PSA wnl.  D/w patient DT:HYHOOIL for colon cancer screening, including IFOB vs. colonoscopy. Risks and benefits of both were discussed and patient voiced understanding. Pt elects NZV:[id number].  Sugar minimally up. D/w pt about low carb diet.  Tetanus likely up to date.  Flu today.  PNA not due.  Shingles not due.  Living will d/w pt. [name] designated if patient were incapacitated.  Diet and exercise d/w pt. Encouraged.  HIV neg prev per patient report, done in 2013.  Pt opts in for HCV screening. D/w pt re: routine screening.

## 2016-02-13 ENCOUNTER — Other Ambulatory Visit: Payer: Self-pay | Admitting: Family Medicine

## 2016-02-13 NOTE — Telephone Encounter (Signed)
Electronic refill request. Last Filled:    30 tablet 0 12/17/2015  Last CPE:  02/04/16   Please advise.

## 2016-02-15 NOTE — Telephone Encounter (Signed)
Please call in.  Thanks.   

## 2016-02-16 NOTE — Telephone Encounter (Signed)
Medication phoned to pharmacy.  

## 2016-02-18 ENCOUNTER — Telehealth: Payer: Self-pay | Admitting: Gastroenterology

## 2016-02-18 ENCOUNTER — Other Ambulatory Visit: Payer: Self-pay | Admitting: *Deleted

## 2016-02-18 NOTE — Telephone Encounter (Signed)
Patient has called and left a message on voicemail concerning his colonoscopy. No details left. Please call patient back.

## 2016-02-18 NOTE — Telephone Encounter (Signed)
Last filled #60 on 01/20/16.  Last seen at CPE on 02/04/16.  Okay to refill?

## 2016-02-19 MED ORDER — AMPHETAMINE-DEXTROAMPHETAMINE 20 MG PO TABS: 20.0000 mg | ORAL_TABLET | Freq: Two times a day (BID) | ORAL | Status: DC

## 2016-02-19 NOTE — Telephone Encounter (Signed)
Patient advised.  Rx left at front desk for pick up. 

## 2016-02-19 NOTE — Telephone Encounter (Signed)
Printed.  Thanks.  

## 2016-02-19 NOTE — Telephone Encounter (Signed)
Mark Simon,  Pt wants wanting to make sure his insurance has been certified before his procedure. I advised him we usually wait a little closer to the scheduled appt as we have a lot of reschedules and cancellations. Pt would like a call back once you have it certified. Thank you!

## 2016-03-03 ENCOUNTER — Encounter: Payer: Self-pay | Admitting: *Deleted

## 2016-03-04 NOTE — Discharge Instructions (Signed)

## 2016-03-08 ENCOUNTER — Ambulatory Visit: Payer: 59 | Admitting: Anesthesiology

## 2016-03-08 ENCOUNTER — Encounter
Admission: RE | Disposition: A | Discharge status: Home / Self Care | Payer: Self-pay | Source: Ambulatory Visit | Attending: Gastroenterology

## 2016-03-08 ENCOUNTER — Ambulatory Visit
Admission: RE | Admit: 2016-03-08 | Discharge: 2016-03-08 | Disposition: A | Discharge status: Home / Self Care | Payer: 59 | Source: Ambulatory Visit | Attending: Gastroenterology | Admitting: Gastroenterology

## 2016-03-08 DIAGNOSIS — Z820 Family history of epilepsy and other diseases of the nervous system: Secondary | ICD-10-CM | POA: Diagnosis not present

## 2016-03-08 DIAGNOSIS — Z880 Allergy status to penicillin: Secondary | ICD-10-CM | POA: Diagnosis not present

## 2016-03-08 DIAGNOSIS — F988 Other specified behavioral and emotional disorders with onset usually occurring in childhood and adolescence: Secondary | ICD-10-CM | POA: Insufficient documentation

## 2016-03-08 DIAGNOSIS — Z79899 Other long term (current) drug therapy: Secondary | ICD-10-CM | POA: Insufficient documentation

## 2016-03-08 DIAGNOSIS — Z825 Family history of asthma and other chronic lower respiratory diseases: Secondary | ICD-10-CM | POA: Diagnosis not present

## 2016-03-08 DIAGNOSIS — F329 Major depressive disorder, single episode, unspecified: Secondary | ICD-10-CM | POA: Diagnosis not present

## 2016-03-08 DIAGNOSIS — Z8249 Family history of ischemic heart disease and other diseases of the circulatory system: Secondary | ICD-10-CM | POA: Insufficient documentation

## 2016-03-08 DIAGNOSIS — K648 Other hemorrhoids: Secondary | ICD-10-CM | POA: Diagnosis not present

## 2016-03-08 DIAGNOSIS — Z9889 Other specified postprocedural states: Secondary | ICD-10-CM | POA: Diagnosis not present

## 2016-03-08 DIAGNOSIS — Z8042 Family history of malignant neoplasm of prostate: Secondary | ICD-10-CM | POA: Diagnosis not present

## 2016-03-08 DIAGNOSIS — K641 Second degree hemorrhoids: Secondary | ICD-10-CM | POA: Diagnosis not present

## 2016-03-08 DIAGNOSIS — Z801 Family history of malignant neoplasm of trachea, bronchus and lung: Secondary | ICD-10-CM | POA: Insufficient documentation

## 2016-03-08 DIAGNOSIS — Z1211 Encounter for screening for malignant neoplasm of colon: Secondary | ICD-10-CM | POA: Diagnosis not present

## 2016-03-08 HISTORY — PX: COLONOSCOPY WITH PROPOFOL: SHX5780

## 2016-03-08 SURGERY — COLONOSCOPY WITH PROPOFOL
Anesthesia: Monitor Anesthesia Care | Wound class: Contaminated

## 2016-03-08 MED ORDER — PROPOFOL 10 MG/ML IV BOLUS
INTRAVENOUS | Status: DC | PRN
  Administered 2016-03-08: 20 mg via INTRAVENOUS
  Administered 2016-03-08: 40 mg via INTRAVENOUS
  Administered 2016-03-08: 20 mg via INTRAVENOUS
  Administered 2016-03-08: 40 mg via INTRAVENOUS
  Administered 2016-03-08: 100 mg via INTRAVENOUS

## 2016-03-08 MED ORDER — LACTATED RINGERS IV SOLN
INTRAVENOUS | Status: DC
  Administered 2016-03-08: 09:00:00 via INTRAVENOUS

## 2016-03-08 MED ORDER — LIDOCAINE HCL (CARDIAC) 20 MG/ML IV SOLN
INTRAVENOUS | Status: DC | PRN
  Administered 2016-03-08: 20 mg via INTRAVENOUS

## 2016-03-08 MED ORDER — STERILE WATER FOR IRRIGATION IR SOLN
Status: DC | PRN
  Administered 2016-03-08: 09:00:00

## 2016-03-08 SURGICAL SUPPLY — 21 items
CANISTER SUCT 1200ML W/VALVE (MISCELLANEOUS) ×3 IMPLANT
CLIP HMST 235XBRD CATH ROT (MISCELLANEOUS) IMPLANT
CLIP RESOLUTION 360 11X235 (MISCELLANEOUS)
FCP ESCP3.2XJMB 240X2.8X (MISCELLANEOUS)
FORCEPS BIOP RAD 4 LRG CAP 4 (CUTTING FORCEPS) IMPLANT
FORCEPS BIOP RJ4 240 W/NDL (MISCELLANEOUS)
FORCEPS ESCP3.2XJMB 240X2.8X (MISCELLANEOUS) IMPLANT
GOWN CVR UNV OPN BCK APRN NK (MISCELLANEOUS) ×2 IMPLANT
GOWN ISOL THUMB LOOP REG UNIV (MISCELLANEOUS) ×4
INJECTOR VARIJECT VIN23 (MISCELLANEOUS) IMPLANT
KIT DEFENDO VALVE AND CONN (KITS) IMPLANT
KIT ENDO PROCEDURE OLY (KITS) ×3 IMPLANT
MARKER SPOT ENDO TATTOO 5ML (MISCELLANEOUS) IMPLANT
PAD GROUND ADULT SPLIT (MISCELLANEOUS) IMPLANT
PROBE APC STR FIRE (PROBE) ×3 IMPLANT
SNARE SHORT THROW 13M SML OVAL (MISCELLANEOUS) IMPLANT
SNARE SHORT THROW 30M LRG OVAL (MISCELLANEOUS) IMPLANT
SPOT EX ENDOSCOPIC TATTOO (MISCELLANEOUS)
VARIJECT INJECTOR VIN23 (MISCELLANEOUS)
WATER STERILE IRR 250ML POUR (IV SOLUTION) ×3 IMPLANT
WIDE-EYE POLYPTRAP (MISCELLANEOUS) IMPLANT

## 2016-03-08 NOTE — H&P (Signed)
[facility]  [address] [place], [place] [address] Phone: [phone] Fax : [phone]  Primary Care Physician:  Crawford Givens, MD Primary Gastroenterologist:  Dr. Servando Snare  Pre-Procedure History & Physical: HPI:  Mark Simon is a 51 y.o. male is here for a screening colonoscopy.   Past Medical History  Diagnosis Date  . Chicken pox   . Depression   . Allergy   . Claustrophobia   . ADD (attention deficit disorder)   . Epicondylitis   . Forearm fracture   . Lower leg fracture     Past Surgical History  Procedure Laterality Date  . Inguinal hernia repair      [id number]  . Femur surgery Left 1974    pinned after fracture  . Tooth extraction      Prior to Admission medications   Medication Sig Start Date End Date Taking? Authorizing Provider  ALPRAZolam Prudy Feeler) 0.25 MG tablet TAKE 1/2 TABLET BY MOUTH TWICE A DAY AS NEEDED 01/15/16  Yes Joaquim Nam, MD  amphetamine-dextroamphetamine (ADDERALL) 20 MG tablet Take 1 tablet (20 mg total) by mouth 2 (two) times daily. 02/19/16  Yes Joaquim Nam, MD  Multiple Vitamin (MULTIVITAMIN) capsule Take 1 capsule by mouth daily.   Yes Historical Provider, MD  zolpidem (AMBIEN) 10 MG tablet TAKE 1 TABLET BY MOUTH AT BEDTIME 02/15/16  Yes Joaquim Nam, MD    Allergies as of 02/05/2016 - Review Complete 02/05/2016  Allergen Reaction Noted  . Penicillins Shortness Of Breath 08/15/2014    Family History  Problem Relation Age of Onset  . Asthma Mother   . Migraines Mother   . Cancer Father     Prostate  . Epilepsy Father   . Cancer Paternal Grandmother     Colon  . Stroke Paternal Grandmother   . Colon cancer Paternal Grandmother   . Heart disease Paternal Grandfather     Social History   Social History  . Marital Status: Single    Spouse Name: N/A  . Number of Children: N/A  . Years of Education: N/A   Occupational History  . Not on file.   Social History Main Topics  . Smoking status:  Never Smoker   . Smokeless tobacco: Never Used  . Alcohol Use: 6.0 oz/week    10 Glasses of wine, 0 Standard drinks or equivalent per week     Comment: 10 drinks a week  . Drug Use: No  . Sexual Activity: Yes   Other Topics Concern  . Not on file   Social History Narrative   From [place]   Did retail work Corporate treasurer, works at [employer] as of 2016   Moved to [facility] 2015   Enjoys reading/painting/sketching.    husband [name]    Review of Systems: See HPI, otherwise negative ROS  Physical Exam: BP 127/91 mmHg  Pulse 83  Temp(Src) 98.1 F (36.7 C) (Tympanic)  Resp 16  Ht 5\' 9"  (1.753 m)  Wt 185 lb (83.915 kg)  BMI 27.31 kg/m2  SpO2 100% General:   Alert,  pleasant and cooperative in NAD Head:  Normocephalic and atraumatic. Neck:  Supple; no masses or thyromegaly. Lungs:  Clear throughout to auscultation.    Heart:  Regular rate and rhythm. Abdomen:  Soft, nontender and nondistended. Normal bowel sounds, without guarding, and without rebound.   Neurologic:  Alert and  oriented x4;  grossly normal neurologically.  Impression/Plan: is  now here to undergo a screening colonoscopy.  Risks, benefits, and alternatives regarding colonoscopy have been reviewed with the patient.  Questions have been answered.  All parties agreeable.

## 2016-03-08 NOTE — Anesthesia Preprocedure Evaluation (Signed)
Anesthesia Evaluation  Patient identified by MRN, date of birth, ID band Patient awake    Reviewed: Allergy & Precautions, H&P , NPO status , Patient's Chart, lab work & pertinent test results, reviewed documented beta blocker date and time   Airway Mallampati: II  TM Distance: >3 FB Neck ROM: full    Dental no notable dental hx.    Pulmonary neg pulmonary ROS,    Pulmonary exam normal breath sounds clear to auscultation       Cardiovascular Exercise Tolerance: Good negative cardio ROS   Rhythm:regular Rate:Normal     Neuro/Psych negative neurological ROS  negative psych ROS   GI/Hepatic negative GI ROS, Neg liver ROS,   Endo/Other  negative endocrine ROS  Renal/GU negative Renal ROS  negative genitourinary   Musculoskeletal   Abdominal   Peds  Hematology negative hematology ROS (+)   Anesthesia Other Findings   Reproductive/Obstetrics negative OB ROS                             Anesthesia Physical Anesthesia Plan  ASA: II  Anesthesia Plan: MAC   Post-op Pain Management:    Induction:   Airway Management Planned:   Additional Equipment:   Intra-op Plan:   Post-operative Plan:   Informed Consent: I have reviewed the patients History and Physical, chart, labs and discussed the procedure including the risks, benefits and alternatives for the proposed anesthesia with the patient or authorized representative who has indicated his/her understanding and acceptance.   Dental Advisory Given  Plan Discussed with: CRNA  Anesthesia Plan Comments:         Anesthesia Quick Evaluation  

## 2016-03-08 NOTE — Op Note (Signed)
[facility] Gastroenterology Patient Name: Mark Simon Procedure Date: 03/08/2016 9:01 AM MRN: [id number] Account #: [id number] Date of Birth: 1966/05/07 Admit Type: Outpatient Age: 50 Room: [facility] OR ROOM 01 Gender: Male Note Status: Finalized Procedure:            Colonoscopy Indications:          Screening for colorectal malignant neoplasm Providers:            Midge Minium, MD Referring MD:         Dwana Curd. Para March (Referring MD) Medicines:            Propofol per Anesthesia Complications:        No immediate complications. Procedure:            Pre-Anesthesia Assessment:                       - Prior to the procedure, a History and Physical was                        performed, and patient medications and allergies were                        reviewed. The patient's tolerance of previous                        anesthesia was also reviewed. The risks and benefits of                        the procedure and the sedation options and risks were                        discussed with the patient. All questions were                        answered, and informed consent was obtained. Prior                        Anticoagulants: The patient has taken no previous                        anticoagulant or antiplatelet agents. ASA Grade                        Assessment: II - A patient with mild systemic disease.                        After reviewing the risks and benefits, the patient was                        deemed in satisfactory condition to undergo the                        procedure.                       After obtaining informed consent, the colonoscope was                        passed under direct vision. Throughout the procedure,  the patient's blood pressure, pulse, and oxygen                        saturations were monitored continuously. The was                        introduced through the anus and advanced to the the        cecum, identified by appendiceal orifice and ileocecal                        valve. The colonoscopy was performed without                        difficulty. The patient tolerated the procedure well.                        The quality of the bowel preparation was excellent. Findings:      The perianal and digital rectal examinations were normal.      Non-bleeding internal hemorrhoids were found during retroflexion. The       hemorrhoids were Grade II (internal hemorrhoids that prolapse but reduce       spontaneously). Impression:           - Non-bleeding internal hemorrhoids.                       - No specimens collected. Recommendation:       - Repeat colonoscopy in 10 years for screening unless                        any change in family history or lower GI problems. Procedure Code(s):    --- Professional ---                       [phone], Colonoscopy, flexible; diagnostic, including                        collection of specimen(s) by brushing or washing, when                        performed (separate procedure) Diagnosis Code(s):    --- Professional ---                       Z12.11, Encounter for screening for malignant neoplasm                        of colon                       K64.1, Second degree hemorrhoids CPT copyright 2016 American Medical Association. All rights reserved. The codes documented in this report are preliminary and upon coder review may  be revised to meet current compliance requirements. Midge Minium, MD 03/08/2016 9:22:24 AM This report has been signed electronically. Number of Addenda: 0 Note Initiated On: 03/08/2016 9:01 AM Scope Withdrawal Time: 0 hours 6 minutes 7 seconds  Total Procedure Duration: 0 hours 9 minutes 23 seconds       [facility]

## 2016-03-08 NOTE — Anesthesia Procedure Notes (Signed)
Procedure Name: MAC Performed by: Ousmane Seeman Pre-anesthesia Checklist: Patient identified, Emergency Drugs available, Suction available, Patient being monitored and Timeout performed Patient Re-evaluated:Patient Re-evaluated prior to inductionOxygen Delivery Method: Nasal cannula       

## 2016-03-08 NOTE — Transfer of Care (Signed)
Immediate Anesthesia Transfer of Care Note  Patient: Mark Simon  Procedure(s) Performed: Procedure(s): COLONOSCOPY WITH PROPOFOL (N/A)  Patient Location: PACU  Anesthesia Type: MAC  Level of Consciousness: awake, alert  and patient cooperative  Airway and Oxygen Therapy: Patient Spontanous Breathing and Patient connected to supplemental oxygen  Post-op Assessment: Post-op Vital signs reviewed, Patient's Cardiovascular Status Stable, Respiratory Function Stable, Patent Airway and No signs of Nausea or vomiting  Post-op Vital Signs: Reviewed and stable  Complications: No apparent anesthesia complications

## 2016-03-08 NOTE — Anesthesia Postprocedure Evaluation (Signed)
Anesthesia Post Note  Patient: Mark Simon  Procedure(s) Performed: Procedure(s) (LRB): COLONOSCOPY WITH PROPOFOL (N/A)  Patient location during evaluation: PACU Anesthesia Type: MAC Level of consciousness: awake and alert Pain management: pain level controlled Vital Signs Assessment: post-procedure vital signs reviewed and stable Respiratory status: spontaneous breathing, nonlabored ventilation, respiratory function stable and patient connected to nasal cannula oxygen Cardiovascular status: blood pressure returned to baseline and stable Postop Assessment: no signs of nausea or vomiting Anesthetic complications: no    [name]

## 2016-03-10 ENCOUNTER — Encounter: Payer: Self-pay | Admitting: Gastroenterology

## 2016-03-15 ENCOUNTER — Other Ambulatory Visit: Payer: Self-pay | Admitting: Family Medicine

## 2016-03-15 NOTE — Telephone Encounter (Signed)
Electronic refill request. Last Filled:    30 tablet 1 01/15/2016  Last office visit:   02/04/16 CPE   Please advise.

## 2016-03-16 ENCOUNTER — Other Ambulatory Visit: Payer: Self-pay | Admitting: Family Medicine

## 2016-03-16 MED ORDER — AMPHETAMINE-DEXTROAMPHETAMINE 20 MG PO TABS: 20.0000 mg | ORAL_TABLET | Freq: Two times a day (BID) | ORAL | Status: DC

## 2016-03-16 NOTE — Telephone Encounter (Signed)
Pt left v/m requesting rx for Adderall. Call when ready for pick up. rx last printed # 60 on 02/19/16 and pt last seen 02/04/2016. Pt request to pick up on 03/17/16 or 03/18/16.

## 2016-03-16 NOTE — Telephone Encounter (Signed)
Please call in.  Thanks.   

## 2016-03-16 NOTE — Telephone Encounter (Signed)
Printed. Thanks. I'll sign when I get to clinic tomorrow afternoon.

## 2016-03-16 NOTE — Telephone Encounter (Signed)
Rx called to pharmacy as instructed. 

## 2016-03-17 NOTE — Telephone Encounter (Signed)
Detailed message left on voicemail that script is up front ready for pickup.(DPR)

## 2016-04-21 ENCOUNTER — Other Ambulatory Visit: Payer: Self-pay | Admitting: *Deleted

## 2016-04-21 MED ORDER — AMPHETAMINE-DEXTROAMPHETAMINE 20 MG PO TABS: 20.0000 mg | ORAL_TABLET | Freq: Two times a day (BID) | ORAL | Status: DC

## 2016-04-21 NOTE — Telephone Encounter (Signed)
Left message on voicemail [facility]) for patient that script is up front ready for pickup.

## 2016-04-21 NOTE — Telephone Encounter (Signed)
Printed.  Thanks.  

## 2016-04-21 NOTE — Telephone Encounter (Signed)
Pt left voicemail at Triage requesting refill of med, last refilled on 03/16/16 #60 with 0 refills

## 2016-05-14 ENCOUNTER — Other Ambulatory Visit: Payer: Self-pay | Admitting: Family Medicine

## 2016-05-14 NOTE — Telephone Encounter (Signed)
Electronic refill request. Last Filled:    30 tablet 1 03/16/2016  Last office visit:   02/04/16  CPE  Please advise.

## 2016-05-14 NOTE — Telephone Encounter (Signed)
Medication phoned to pharmacy.  

## 2016-05-14 NOTE — Telephone Encounter (Signed)
Please call in.  Thanks.   

## 2016-06-13 ENCOUNTER — Other Ambulatory Visit: Payer: Self-pay | Admitting: Family Medicine

## 2016-06-14 ENCOUNTER — Other Ambulatory Visit: Payer: Self-pay

## 2016-06-14 MED ORDER — AMPHETAMINE-DEXTROAMPHETAMINE 20 MG PO TABS: 20.0000 mg | ORAL_TABLET | Freq: Two times a day (BID) | ORAL | Status: DC

## 2016-06-14 NOTE — Telephone Encounter (Signed)
Printed.  Thanks.  

## 2016-06-14 NOTE — Telephone Encounter (Signed)
Pt left v/m requesting rx for Adderall. Call when ready for pick up. rx last printed # 60 on 04/21/16; pt last seen annual exam on 02/04/16.

## 2016-06-14 NOTE — Telephone Encounter (Signed)
Electronic refill request. Last Filled:    30 tablet 1 02/15/2016  Last office visit:   02/04/2016 CPE    Please advise.

## 2016-06-14 NOTE — Telephone Encounter (Signed)
Left detailed message on voicemail. Rx left at front desk for pick up.  

## 2016-06-15 NOTE — Telephone Encounter (Signed)
Please call in.  Thanks.   

## 2016-06-15 NOTE — Telephone Encounter (Signed)
Medication phoned to pharmacy.  

## 2016-06-23 DIAGNOSIS — H524 Presbyopia: Secondary | ICD-10-CM | POA: Diagnosis not present

## 2016-07-14 ENCOUNTER — Other Ambulatory Visit: Payer: Self-pay | Admitting: Family Medicine

## 2016-07-14 NOTE — Telephone Encounter (Signed)
Please call in.  Thanks.   

## 2016-07-15 NOTE — Telephone Encounter (Signed)
[facility] to [facility] [place].

## 2016-07-20 ENCOUNTER — Other Ambulatory Visit: Payer: Self-pay

## 2016-07-20 MED ORDER — AMPHETAMINE-DEXTROAMPHETAMINE 20 MG PO TABS: 20.0000 mg | ORAL_TABLET | Freq: Two times a day (BID) | ORAL | 0 refills | Status: DC

## 2016-07-20 NOTE — Telephone Encounter (Signed)
Printed.  Thanks.  

## 2016-07-20 NOTE — Telephone Encounter (Signed)
Pt left v/m requesting rx for Adderall. Call when ready for pick up. Last printed # 60 on 06/14/16. Last seen 02/04/16.

## 2016-07-21 NOTE — Telephone Encounter (Signed)
Left detailed message on voicemail (DPR) that script is up front ready for pickup. 

## 2016-07-21 NOTE — Telephone Encounter (Signed)
Called patient to inform Rx ready for pick up. No answer. Will try later.

## 2016-08-23 ENCOUNTER — Other Ambulatory Visit: Payer: Self-pay

## 2016-08-23 MED ORDER — AMPHETAMINE-DEXTROAMPHETAMINE 20 MG PO TABS: 20.0000 mg | ORAL_TABLET | Freq: Two times a day (BID) | ORAL | 0 refills | Status: DC

## 2016-08-23 NOTE — Telephone Encounter (Signed)
Printed.  Thanks.  

## 2016-08-23 NOTE — Telephone Encounter (Signed)
Left detailed message on voicemail. Rx left at front desk for pick up.  

## 2016-08-23 NOTE — Telephone Encounter (Signed)
Pt left v/m requesting rx for Adderall. Call when ready for pick up. rx last printed #60 on 07/20/16 and last seen 02/04/2016.

## 2016-09-14 ENCOUNTER — Other Ambulatory Visit: Payer: Self-pay | Admitting: Family Medicine

## 2016-09-15 NOTE — Telephone Encounter (Signed)
Last filled on 07/14/16 #30 +1, last OV  02/04/16. Ok to refill?

## 2016-09-16 NOTE — Telephone Encounter (Signed)
Left refill on voice mail at pharmacy  

## 2016-09-16 NOTE — Telephone Encounter (Signed)
[id number] [place] [place] [address]: [phone]

## 2016-09-27 ENCOUNTER — Other Ambulatory Visit: Payer: Self-pay

## 2016-09-27 NOTE — Telephone Encounter (Signed)
Pt left v/m requesting rx for Adderall. Call when ready for pick up. Last printed # 60 on 08/23/16. Last annual 02/04/16.

## 2016-09-28 MED ORDER — AMPHETAMINE-DEXTROAMPHETAMINE 20 MG PO TABS: 20.0000 mg | ORAL_TABLET | Freq: Two times a day (BID) | ORAL | 0 refills | Status: DC

## 2016-09-28 NOTE — Telephone Encounter (Signed)
Printed.  Thanks.  

## 2016-09-28 NOTE — Telephone Encounter (Signed)
Patient advised.  Rx left at front desk for pick up. 

## 2016-10-13 ENCOUNTER — Other Ambulatory Visit: Payer: Self-pay | Admitting: Family Medicine

## 2016-10-14 NOTE — Telephone Encounter (Signed)
Electronic refill request. Last Filled:    30 tablet 1 06/15/2016  Last office visit:   02/04/16  Please advise.

## 2016-10-15 NOTE — Telephone Encounter (Signed)
Rx called in as prescribed 

## 2016-10-15 NOTE — Telephone Encounter (Signed)
Please call in.  Thanks.   

## 2016-10-26 ENCOUNTER — Other Ambulatory Visit: Payer: Self-pay

## 2016-10-26 NOTE — Telephone Encounter (Signed)
Pt left v/m requesting rx for Adderall. Call when ready for pick up. rx last printed #60 on 09/28/16. Last seen 02/04/16.

## 2016-10-27 MED ORDER — AMPHETAMINE-DEXTROAMPHETAMINE 20 MG PO TABS: 20.0000 mg | ORAL_TABLET | Freq: Two times a day (BID) | ORAL | 0 refills | Status: DC

## 2016-10-27 NOTE — Telephone Encounter (Signed)
Printed.  Thanks.  

## 2016-10-27 NOTE — Telephone Encounter (Signed)
Message left for patient to return my call.  

## 2016-11-01 NOTE — Telephone Encounter (Signed)
Left detailed message on voicemail. Rx left at front desk for pick up.  

## 2016-11-12 ENCOUNTER — Other Ambulatory Visit: Payer: Self-pay | Admitting: Family Medicine

## 2016-11-15 NOTE — Telephone Encounter (Signed)
Electronic refill request. Last Filled:    30 tablet 1 09/15/2016  Last office visit:   02/04/16 CPE.  Please advise.

## 2016-11-16 NOTE — Telephone Encounter (Signed)
Please call in.  Thanks.   

## 2016-11-16 NOTE — Telephone Encounter (Signed)
Rx called to pharmacy as instructed. 

## 2016-12-08 ENCOUNTER — Other Ambulatory Visit: Payer: Self-pay

## 2016-12-08 NOTE — Telephone Encounter (Signed)
Pt left v/m requesting rx for Adderall. Call when ready for pick up. rx last printed # 60 on 10/27/16. Pt last seen annual exam 02/04/2016.

## 2016-12-09 MED ORDER — AMPHETAMINE-DEXTROAMPHETAMINE 20 MG PO TABS: 20.0000 mg | ORAL_TABLET | Freq: Two times a day (BID) | ORAL | 0 refills | Status: DC

## 2016-12-09 NOTE — Telephone Encounter (Signed)
Patient notified by telephone that script is up front ready for pickup. Patient stated that he will schedule his physical when he comes in to pick the script up.

## 2016-12-09 NOTE — Telephone Encounter (Signed)
Printed. Thanks.  Due for CPE in spring.  Thanks.

## 2017-01-12 ENCOUNTER — Other Ambulatory Visit: Payer: Self-pay | Admitting: Family Medicine

## 2017-01-12 NOTE — Telephone Encounter (Signed)
Received refill request electronically Last refill 11/16/16 #30/1 Last office visit 02/04/16

## 2017-01-13 NOTE — Telephone Encounter (Signed)
Please call in.  Thanks.  Has f/u pending.   

## 2017-01-13 NOTE — Telephone Encounter (Signed)
Rx called in as prescribed 

## 2017-01-31 ENCOUNTER — Other Ambulatory Visit: Payer: Self-pay

## 2017-01-31 NOTE — Telephone Encounter (Signed)
Pt left v/m requesting rx for Adderall. Call when ready for pick up. rx last printed # 60 on 12/09/2016. Last annual 02/04/16 and pt has CPX scheduled on 03/02/17.

## 2017-02-01 MED ORDER — AMPHETAMINE-DEXTROAMPHETAMINE 20 MG PO TABS: 20.0000 mg | ORAL_TABLET | Freq: Two times a day (BID) | ORAL | 0 refills | Status: DC

## 2017-02-01 NOTE — Telephone Encounter (Signed)
Printed.  Thanks.  

## 2017-02-01 NOTE — Telephone Encounter (Signed)
Left detailed message on voicemail (DPR) that script is up front ready for pickup. 

## 2017-02-07 ENCOUNTER — Other Ambulatory Visit: Payer: Self-pay | Admitting: Family Medicine

## 2017-02-07 NOTE — Telephone Encounter (Signed)
Last office visit 02/04/2016.  Last refilled: Zolpidem 10/15/16 for #30 with 1 refill. Alprazolam 01/13/2017 for #30 with 1 refill.  CPE scheduled for 03/02/17.  Ok to refill?

## 2017-02-07 NOTE — Telephone Encounter (Signed)
Xanax is too early. Denied. Please call in Ambien.

## 2017-02-08 NOTE — Telephone Encounter (Signed)
Rx called in to requested pharmacy 

## 2017-02-15 NOTE — Telephone Encounter (Signed)
Pt left v/m; pts ins this year is requiring all maintenance meds to be filled at [facility] outpt pharmacy. [facility]. Pt has the prescription and wants to know if OK to take to [facility] pharmacy.pt request cb/

## 2017-02-16 NOTE — Telephone Encounter (Signed)
Patient notified as instructed by telephone and verbalized understanding. 

## 2017-02-16 NOTE — Telephone Encounter (Signed)
Okay to change pharmacy, please have patient update/initial the change on a contact when at next OV.  Thanks.

## 2017-03-02 ENCOUNTER — Encounter: Payer: 59 | Admitting: Family Medicine

## 2017-03-16 ENCOUNTER — Other Ambulatory Visit: Payer: Self-pay | Admitting: Family Medicine

## 2017-03-17 NOTE — Telephone Encounter (Signed)
Electronic refill request.  Last Filled:   Alprazolam   30 tablet 1 01/13/2017  Last Filled:   Zolpidem   30 tablet 0 02/07/2017  Last office visit:   02/04/16.Marland KitchenPlease advise.

## 2017-03-17 NOTE — Telephone Encounter (Signed)
Medication phoned to pharmacy.  

## 2017-03-17 NOTE — Telephone Encounter (Signed)
Please call in.  Thanks.  Has f/u pending.   

## 2017-03-30 ENCOUNTER — Ambulatory Visit (INDEPENDENT_AMBULATORY_CARE_PROVIDER_SITE_OTHER): Payer: 59 | Admitting: Family Medicine

## 2017-03-30 ENCOUNTER — Encounter: Payer: Self-pay | Admitting: Family Medicine

## 2017-03-30 VITALS — BP 120/88 | HR 84 | Temp 98.5°F | Ht 69.0 in | Wt 188.0 lb

## 2017-03-30 DIAGNOSIS — Z125 Encounter for screening for malignant neoplasm of prostate: Secondary | ICD-10-CM | POA: Diagnosis not present

## 2017-03-30 DIAGNOSIS — Z131 Encounter for screening for diabetes mellitus: Secondary | ICD-10-CM

## 2017-03-30 DIAGNOSIS — Z23 Encounter for immunization: Secondary | ICD-10-CM | POA: Diagnosis not present

## 2017-03-30 DIAGNOSIS — R41840 Attention and concentration deficit: Secondary | ICD-10-CM

## 2017-03-30 DIAGNOSIS — Z7189 Other specified counseling: Secondary | ICD-10-CM

## 2017-03-30 DIAGNOSIS — F32A Depression, unspecified: Secondary | ICD-10-CM

## 2017-03-30 DIAGNOSIS — F329 Major depressive disorder, single episode, unspecified: Secondary | ICD-10-CM

## 2017-03-30 DIAGNOSIS — G4700 Insomnia, unspecified: Secondary | ICD-10-CM

## 2017-03-30 DIAGNOSIS — Z1159 Encounter for screening for other viral diseases: Secondary | ICD-10-CM

## 2017-03-30 DIAGNOSIS — Z0000 Encounter for general adult medical examination without abnormal findings: Secondary | ICD-10-CM

## 2017-03-30 MED ORDER — AMPHETAMINE-DEXTROAMPHETAMINE 20 MG PO TABS: 20.0000 mg | ORAL_TABLET | Freq: Two times a day (BID) | ORAL | 0 refills | Status: DC

## 2017-03-30 NOTE — Patient Instructions (Signed)
Take care.  Glad to see you.  Go to the lab on the way out.  We'll contact you with your lab report. Update me as needed.  

## 2017-03-30 NOTE — Progress Notes (Signed)
CPE- See plan.  Routine anticipatory guidance given to patient.  See health maintenance.  The possibility exists that previously documented standard health maintenance information may have been brought forward from a previous encounter into this note.  If needed, that same information has been updated to reflect the current situation based on today's encounter.    PSA pending, d/w pt.   Colonoscopy 2017 Tetanus d/w pt.   Flu encouraged PNA not due.  Shingles not due.  Living will d/w pt. Mark Simon designated if patient were incapacitated.  Diet and exercise d/w pt. Encouraged.  "I could do better."   HIV neg prev per patient report, done in 2013.  Pt opts in for HCV screening. D/w pt re: routine screening.   ADD.  Concentration is likely better on the medicine. No ADE on med.  Complaint.  He limits the dose in the afternoon to try to limit insomnia.    Ambien use for insomnia.  He cut back to 1/2 tab at night.  No ADE on med.  Routine cautions d/w pt.    Anxiety.  Using PRN BZD.  He is trying to cut back.  No ADE on med.    PMH and SH reviewed  Meds, vitals, and allergies reviewed.   ROS: Per HPI.  Unless specifically indicated otherwise in HPI, the patient denies:  General: fever. Eyes: acute vision changes ENT: sore throat Cardiovascular: chest pain Respiratory: SOB GI: vomiting GU: dysuria Musculoskeletal: acute back pain Derm: acute rash Neuro: acute motor dysfunction Psych: worsening mood Endocrine: polydipsia Heme: bleeding Allergy: hayfever  GEN: nad, alert and oriented HEENT: mucous membranes moist NECK: supple w/o LA CV: rrr. PULM: ctab, no inc wob ABD: soft, +bs EXT: no edema SKIN: no acute rash

## 2017-03-30 NOTE — Progress Notes (Signed)
Pre visit review using our clinic review tool, if applicable. No additional management support is needed unless otherwise documented below in the visit note. 

## 2017-03-31 LAB — GLUCOSE, RANDOM: GLUCOSE: 90 mg/dL (ref 70–99)

## 2017-03-31 LAB — HEPATITIS C ANTIBODY: HCV AB: NEGATIVE

## 2017-03-31 LAB — PSA: PSA: 0.87 ng/mL (ref 0.10–4.00)

## 2017-04-01 NOTE — Assessment & Plan Note (Addendum)
PSA pending, d/w pt.   Colonoscopy 2017 Tetanus 2018 Flu encouraged PNA not due.  Shingles not due.  Living will d/w pt. Mark Simon designated if patient were incapacitated.  Diet and exercise d/w pt. Encouraged.  "I could do better."   HIV neg prev per patient report, done in 2013.  Pt opts in for HCV screening. D/w pt re: routine screening.

## 2017-04-01 NOTE — Assessment & Plan Note (Signed)
With some anxiety. Continue as is. No ADE on medicine. He is trying to limit use as much as possible. Mood stable. Okay for outpatient follow-up.

## 2017-04-01 NOTE — Assessment & Plan Note (Signed)
Continue as is. No ADE on medicine. He is trying to limit use as much as possible.

## 2017-04-01 NOTE — Assessment & Plan Note (Signed)
Controlled. No ADE on medication. Update me as needed. Continue as is.

## 2017-04-01 NOTE — Assessment & Plan Note (Signed)
Living will d/w pt. Mark Simon designated if patient were incapacitated. 

## 2017-04-17 ENCOUNTER — Other Ambulatory Visit: Payer: Self-pay | Admitting: Family Medicine

## 2017-04-18 NOTE — Telephone Encounter (Signed)
Last office visit 03/30/2017.  Last refilled 03/17/2017 for #30 with no refills.  Ok to refill?

## 2017-04-19 NOTE — Telephone Encounter (Signed)
Rx called to pharmacy as instructed. 

## 2017-04-19 NOTE — Telephone Encounter (Signed)
Please call in.  Thanks.   

## 2017-05-04 ENCOUNTER — Other Ambulatory Visit: Payer: Self-pay

## 2017-05-04 NOTE — Telephone Encounter (Signed)
Pt left v/m requesting rx for Adderall. Call when ready for pick up. Last printed # 60 on 03/30/17 and last annual on 03/30/17.

## 2017-05-05 MED ORDER — AMPHETAMINE-DEXTROAMPHETAMINE 20 MG PO TABS: 20.0000 mg | ORAL_TABLET | Freq: Two times a day (BID) | ORAL | 0 refills | Status: DC

## 2017-05-05 NOTE — Telephone Encounter (Signed)
Printed and in CMA box 

## 2017-05-05 NOTE — Telephone Encounter (Signed)
Lm on pts vm and informed him Rx is available for pickup from the front desk. Pt advised third party unable to pickup  

## 2017-05-06 ENCOUNTER — Encounter: Payer: Self-pay | Admitting: Family Medicine

## 2017-05-09 ENCOUNTER — Encounter: Payer: Self-pay | Admitting: Family Medicine

## 2017-05-09 DIAGNOSIS — Z79899 Other long term (current) drug therapy: Secondary | ICD-10-CM | POA: Diagnosis not present

## 2017-05-17 ENCOUNTER — Other Ambulatory Visit: Payer: Self-pay | Admitting: Family Medicine

## 2017-05-18 ENCOUNTER — Other Ambulatory Visit: Payer: Self-pay | Admitting: Family Medicine

## 2017-06-09 ENCOUNTER — Other Ambulatory Visit: Payer: Self-pay

## 2017-06-09 MED ORDER — AMPHETAMINE-DEXTROAMPHETAMINE 20 MG PO TABS: 20.0000 mg | ORAL_TABLET | Freq: Two times a day (BID) | ORAL | 0 refills | Status: DC

## 2017-06-09 NOTE — Telephone Encounter (Signed)
Printed.  Thanks.  

## 2017-06-09 NOTE — Telephone Encounter (Signed)
Pt left v/m requesting rx for Adderall. Call when ready for pick up. rx last printed # 60 on 05/05/17. Last seen for annual 03/30/17.

## 2017-06-10 NOTE — Telephone Encounter (Signed)
Patient advised.  Rx left at front desk for pick up. 

## 2017-06-12 ENCOUNTER — Other Ambulatory Visit: Payer: Self-pay | Admitting: Family Medicine

## 2017-06-13 NOTE — Telephone Encounter (Signed)
Electronic refill request. Zolpidem Last office visit:   03/30/17 Last Filled:    30 tablet 1 04/19/2017  Please advise.

## 2017-06-13 NOTE — Telephone Encounter (Signed)
Please call in.  Thanks.   

## 2017-06-14 NOTE — Telephone Encounter (Signed)
Rx called in to requested pharmacy 

## 2017-07-05 ENCOUNTER — Other Ambulatory Visit: Payer: Self-pay | Admitting: *Deleted

## 2017-07-05 NOTE — Telephone Encounter (Signed)
Patient left a voicemail requesting a refill on Adderall Last refill 06/09/17 #60 Last office visit 03/30/17

## 2017-07-06 MED ORDER — AMPHETAMINE-DEXTROAMPHETAMINE 20 MG PO TABS: 20.0000 mg | ORAL_TABLET | Freq: Two times a day (BID) | ORAL | 0 refills | Status: DC

## 2017-07-06 NOTE — Telephone Encounter (Signed)
Printed. I'll sign when I get back to clinic.  Thanks.

## 2017-07-07 NOTE — Telephone Encounter (Signed)
Left detailed message on voicemail. Rx left at front desk for pick up.  

## 2017-07-21 ENCOUNTER — Other Ambulatory Visit: Payer: Self-pay | Admitting: Family Medicine

## 2017-07-22 NOTE — Telephone Encounter (Signed)
Electronic refill request.  Zolpidem Last office visit:   03/30/17 Last Filled:    30 tablet 1 06/13/2017  Please advise.

## 2017-07-23 NOTE — Telephone Encounter (Signed)
Too early to refill.  We can fill next month.  Thanks.

## 2017-08-25 ENCOUNTER — Other Ambulatory Visit: Payer: Self-pay

## 2017-08-25 NOTE — Telephone Encounter (Signed)
Pt left v/m requesting rx for Adderall. Call when ready for pick up. Last printed # 60 on 07/06/17;last seen annual 03/30/17.

## 2017-08-26 MED ORDER — AMPHETAMINE-DEXTROAMPHETAMINE 20 MG PO TABS: 20.0000 mg | ORAL_TABLET | Freq: Two times a day (BID) | ORAL | 0 refills | Status: DC

## 2017-08-26 NOTE — Telephone Encounter (Signed)
Lm on pts vm and informed him Rx is available for pickup from the front desk 

## 2017-08-26 NOTE — Telephone Encounter (Signed)
Initial Rx printed incorrectly. Reprinted. Thanks. First prescription was shredded.

## 2017-09-14 ENCOUNTER — Ambulatory Visit (INDEPENDENT_AMBULATORY_CARE_PROVIDER_SITE_OTHER): Payer: 59 | Admitting: Family Medicine

## 2017-09-14 ENCOUNTER — Encounter: Payer: Self-pay | Admitting: Family Medicine

## 2017-09-14 VITALS — BP 118/78 | HR 85 | Temp 98.7°F | Wt 186.0 lb

## 2017-09-14 DIAGNOSIS — J069 Acute upper respiratory infection, unspecified: Secondary | ICD-10-CM

## 2017-09-14 DIAGNOSIS — J029 Acute pharyngitis, unspecified: Secondary | ICD-10-CM

## 2017-09-14 LAB — POCT RAPID STREP A (OFFICE): RAPID STREP A SCREEN: NEGATIVE

## 2017-09-14 MED ORDER — LIDOCAINE VISCOUS 2 % MT SOLN: 10.0000 mL | Freq: Four times a day (QID) | OROMUCOSAL | 0 refills | Status: DC | PRN

## 2017-09-14 NOTE — Progress Notes (Signed)
Yesterday he went to work and he has throat irritation and pain with swallowing.  This was a clear change.  Today is some better.  Post nasal gtt and irritation.  He can still clear his secretions, swallowing well, but with pain.  No choking.  Felt clammy/hot/feverish, but no documented fevers >100.4.  No vomiting.  Had diarrhea yesterday but not today.  No abd pain.  No cough but clearing his throat a lot.  No ear pain. No rash.    Meds, vitals, and allergies reviewed.   ROS: Per HPI unless specifically indicated in ROS section   GEN: nad, alert and oriented HEENT: mucous membranes moist, tm w/o erythema but B SOM noted, nasal exam w/o erythema, clear discharge noted,  OP with cobblestoning, no exudates NECK: supple w/ slightly tender LA CV: rrr.   PULM: ctab, no inc wob EXT: no edema  RST neg.

## 2017-09-14 NOTE — Patient Instructions (Signed)
Rest and fluids, nyquil, gargle with salt water.   Your chest sounds clear.   Likely a virus.   Update [place] as needed.   Lidocaine if needed for throat pain.

## 2017-09-15 NOTE — Assessment & Plan Note (Signed)
RST neg.  D/w pt. Likely a benign viral process.  Rest and fluids, nyquil, gargle with salt water.   His chest sounds clear.   Update [place] as needed.   Lidocaine if needed for throat pain.  He agrees.

## 2017-09-18 ENCOUNTER — Other Ambulatory Visit: Payer: Self-pay | Admitting: Family Medicine

## 2017-09-19 NOTE — Telephone Encounter (Signed)
Electronic refill request. Zolpidem Last office visit:   09/14/17 Last Filled:    30 tablet 1 06/13/2017  Please advise.

## 2017-09-20 NOTE — Telephone Encounter (Signed)
Please call in.  Thanks.   

## 2017-09-20 NOTE — Telephone Encounter (Signed)
Medication phoned to pharmacy.  

## 2017-09-30 ENCOUNTER — Other Ambulatory Visit: Payer: Self-pay | Admitting: Family Medicine

## 2017-09-30 NOTE — Telephone Encounter (Signed)
Electronic refill request. Alprazolam Last office visit:   09/14/17 Last Filled:    30 tablet 1 03/17/2017  Please advise.

## 2017-10-01 NOTE — Telephone Encounter (Signed)
Please call in.  Thanks.   

## 2017-10-03 NOTE — Telephone Encounter (Signed)
Medication phoned to pharmacy.  

## 2017-10-12 ENCOUNTER — Other Ambulatory Visit: Payer: Self-pay | Admitting: Family Medicine

## 2017-10-12 NOTE — Telephone Encounter (Signed)
Controlled substance 

## 2017-10-12 NOTE — Telephone Encounter (Signed)
Last Rx 09/25/2017. Last OV 09/14/2017-acute

## 2017-10-12 NOTE — Telephone Encounter (Signed)
Copied from CRM #[id number]. Topic: Quick Communication - See Telephone Encounter >> Oct 12, 2017 12:29 PM Rudi Coco, NT wrote: CRM for notification. See Telephone encounter for:   10/12/17. Pt. Mark Simon Called to see if he can get a refill Rx. On Adderall. Pt. Will be able to come by to pick up when ready

## 2017-10-13 MED ORDER — AMPHETAMINE-DEXTROAMPHETAMINE 20 MG PO TABS: 20.0000 mg | ORAL_TABLET | Freq: Two times a day (BID) | ORAL | 0 refills | Status: DC

## 2017-10-13 NOTE — Telephone Encounter (Signed)
Left detailed message on voicemail. Rx left at front desk for pick up.  

## 2017-10-13 NOTE — Telephone Encounter (Signed)
Last rx 08/26/17 by EMR.  Printed.  Thanks.

## 2017-11-21 ENCOUNTER — Other Ambulatory Visit: Payer: Self-pay | Admitting: Family Medicine

## 2017-11-21 NOTE — Telephone Encounter (Signed)
Copied from CRM [id number]. Topic: Quick Communication - Rx Refill/Question >> Nov 21, 2017 12:48 PM Oneal Grout wrote: Has the patient contacted their pharmacy? Yes.     (Agent: If no, request that the patient contact the pharmacy for the refill.)   Preferred Pharmacy (with phone number or street name): [facility]    Agent: Please be advised that RX refills may take up to 3 business days. We ask that you follow-up with your pharmacy. Requesting refill on adderall

## 2017-11-21 NOTE — Telephone Encounter (Signed)
Copied from CRM [id number]. Topic: Quick Communication - Rx Refill/Question >> Nov 21, 2017 12:48 PM Oneal Grout wrote: Has the patient contacted their pharmacy? yes   (Agent: If no, request that the patient contact the pharmacy for the refill.)   Preferred Pharmacy (with phone number or street name): [facility]    Agent: Please be advised that RX refills may take up to 3 business days. We ask that you follow-up with your pharmacy. Requesting refill on adderall

## 2017-11-21 NOTE — Telephone Encounter (Signed)
Rx request 

## 2017-11-21 NOTE — Telephone Encounter (Signed)
Last Rx 10/13/2017, Last OV 09/14/2017-acute

## 2017-11-22 MED ORDER — AMPHETAMINE-DEXTROAMPHETAMINE 20 MG PO TABS: 20.0000 mg | ORAL_TABLET | Freq: Two times a day (BID) | ORAL | 0 refills | Status: DC

## 2017-11-22 NOTE — Telephone Encounter (Signed)
Left detailed message on voicemail. Rx left at front desk for pick up.  

## 2017-11-22 NOTE — Telephone Encounter (Signed)
Printed.  Thanks.  

## 2017-12-12 ENCOUNTER — Other Ambulatory Visit: Payer: Self-pay | Admitting: Family Medicine

## 2017-12-12 NOTE — Telephone Encounter (Signed)
Electronic refill request. Zolpidem Last office visit:   09/14/17 Last Filled:    30 tablet 1 09/20/2017  Please advise.

## 2017-12-13 NOTE — Telephone Encounter (Signed)
Medication phoned to pharmacy.  

## 2017-12-13 NOTE — Telephone Encounter (Signed)
Please call in.  Thanks.   

## 2017-12-19 ENCOUNTER — Telehealth: Payer: Self-pay | Admitting: *Deleted

## 2017-12-19 ENCOUNTER — Other Ambulatory Visit: Payer: Self-pay | Admitting: *Deleted

## 2017-12-19 ENCOUNTER — Telehealth: Payer: Self-pay | Admitting: Family Medicine

## 2017-12-19 NOTE — Telephone Encounter (Signed)
Patient advised and refused appointment.

## 2017-12-19 NOTE — Telephone Encounter (Signed)
Needs OV.  Thanks. 

## 2017-12-19 NOTE — Telephone Encounter (Signed)
Copied from CRM [id number]. Topic: Quick Communication - See Telephone Encounter >> Dec 19, 2017  1:25 PM Diana Eves B wrote: CRM for notification. See Telephone encounter for:  Pt wanting to know if zpack can be called in due symptoms such as nauseous, diarrhea, and body aches, slight cough. No fever or  sore throat.  12/19/17.

## 2017-12-19 NOTE — Telephone Encounter (Signed)
Patient refill request:  Adderall Last office visit:   09/14/17 Last Filled:    60 tablet 0 11/22/2017  Please advise.

## 2017-12-20 MED ORDER — AMPHETAMINE-DEXTROAMPHETAMINE 20 MG PO TABS: 20.0000 mg | ORAL_TABLET | Freq: Two times a day (BID) | ORAL | 0 refills | Status: DC

## 2017-12-20 NOTE — Telephone Encounter (Signed)
Patient advised.  Rx left at front desk for pick up. 

## 2017-12-20 NOTE — Telephone Encounter (Signed)
Printed.  Thanks.  

## 2017-12-20 NOTE — Telephone Encounter (Signed)
Erroneous encounter

## 2017-12-26 DIAGNOSIS — H524 Presbyopia: Secondary | ICD-10-CM | POA: Diagnosis not present

## 2018-01-31 ENCOUNTER — Other Ambulatory Visit: Payer: Self-pay | Admitting: Family Medicine

## 2018-01-31 NOTE — Telephone Encounter (Signed)
Copied from CRM [id number]. Topic: Quick Communication - Rx Refill/Question >> Jan 31, 2018 12:48 PM Floria Raveling A wrote: Medication: amphetamine-dextroamphetamine (ADDERALL) 20 MG tablet [[id number]]    Has the patient contacted their pharmacy? No    (Agent: If no, request that the patient contact the pharmacy for the refill.)   Preferred Pharmacy (with phone number or street name): Pick up script at the office    Agent: Please be advised that RX refills may take up to 3 business days. We ask that you follow-up with your pharmacy.

## 2018-02-07 ENCOUNTER — Encounter: Payer: Self-pay | Admitting: Emergency Medicine

## 2018-02-07 ENCOUNTER — Other Ambulatory Visit: Payer: Self-pay

## 2018-02-07 ENCOUNTER — Ambulatory Visit
Admission: EM | Admit: 2018-02-07 | Discharge: 2018-02-07 | Disposition: A | Discharge status: Home / Self Care | Payer: 59 | Attending: Family Medicine | Admitting: Family Medicine

## 2018-02-07 DIAGNOSIS — M25512 Pain in left shoulder: Secondary | ICD-10-CM

## 2018-02-07 MED ORDER — CYCLOBENZAPRINE HCL 10 MG PO TABS: 10.0000 mg | ORAL_TABLET | Freq: Three times a day (TID) | ORAL | 0 refills | Status: DC | PRN

## 2018-02-07 NOTE — Discharge Instructions (Signed)
Rest, ice/heat, ibuprofen 600mg  three times daily

## 2018-02-07 NOTE — ED Provider Notes (Signed)
[facility]    CSN: [id number] Arrival date & time: 02/07/18  0816     History   Chief Complaint Chief Complaint  Patient presents with  . Shoulder Pain    left    HPI Mark Simon is a 52 y.o. male.   The history is provided by the patient.  Shoulder Pain  Location:  Shoulder Shoulder location:  L shoulder Injury: no   Pain details:    Quality:  Aching   Radiates to:  Does not radiate   Severity:  Moderate   Onset quality:  Sudden   Duration:  1 day   Timing:  Constant   Progression:  Unchanged Handedness:  Right-handed Dislocation: no   Foreign body present:  No foreign bodies Prior injury to area:  No Relieved by:  None tried Ineffective treatments:  None tried Associated symptoms: tingling   Associated symptoms: no back pain, no decreased range of motion, no fatigue, no fever, no muscle weakness, no neck pain, no numbness, no stiffness and no swelling     Past Medical History:  Diagnosis Date  . ADD (attention deficit disorder)   . Allergy   . Chicken pox   . Claustrophobia   . Depression   . Epicondylitis   . Forearm fracture   . Lower leg fracture    hx mult fx    Patient Active Problem List   Diagnosis Date Noted  . Special screening for malignant neoplasms, colon   . URI (upper respiratory infection) 04/23/2015  . FH: prostate cancer 02/10/2015  . Routine general medical examination at a health care facility 01/23/2015  . Advance care planning 01/23/2015  . Depression 08/15/2014  . Difficulty concentrating 08/15/2014  . Insomnia 08/15/2014  . Erectile dysfunction 08/15/2014    Past Surgical History:  Procedure Laterality Date  . COLONOSCOPY WITH PROPOFOL N/A 03/08/2016   Procedure: COLONOSCOPY WITH PROPOFOL;  Surgeon: Midge Minium, MD;  Location[facility];  Service: Endoscopy;  Laterality: N/A;  . FEMUR SURGERY Left 1974   pinned after fracture  . INGUINAL HERNIA REPAIR     [id number]  . TOOTH EXTRACTION          Home Medications    Prior to Admission medications   Medication Sig Start Date End Date Taking? Authorizing Provider  ALPRAZolam Prudy Feeler) 0.25 MG tablet TAKE 1/2 TABLET BY MOUTH TWICE DAILY AS NEEDED 10/01/17  Yes Joaquim Nam, MD  amphetamine-dextroamphetamine (ADDERALL) 20 MG tablet Take 1 tablet (20 mg total) by mouth 2 (two) times daily. 12/20/17  Yes Joaquim Nam, MD  Multiple Vitamin (MULTIVITAMIN) capsule Take 1 capsule by mouth daily.   Yes [provider]  zolpidem (AMBIEN) 10 MG tablet TAKE 1 TABLET BY MOUTH AT BEDTIME 12/13/17  Yes Joaquim Nam, MD  cyclobenzaprine (FLEXERIL) 10 MG tablet Take 1 tablet (10 mg total) by mouth 3 (three) times daily as needed for muscle spasms. 02/07/18   Payton Mccallum, MD  fexofenadine (ALLEGRA) 180 MG tablet Take 180 mg by mouth daily as needed for allergies or rhinitis.    [provider]  lidocaine (XYLOCAINE) 2 % solution Use as directed 10 mLs in the mouth or throat every 6 (six) hours as needed for mouth pain. 09/14/17   Joaquim Nam, MD    Family History Family History  Problem Relation Age of Onset  . Asthma Mother   . Migraines Mother   . Cancer Father  Prostate  . Epilepsy Father   . Cancer Paternal Grandmother        Colon  . Stroke Paternal Grandmother   . Colon cancer Paternal Grandmother   . Heart disease Paternal Grandfather     Social History Social History   Tobacco Use  . Smoking status: Never Smoker  . Smokeless tobacco: Never Used  Substance Use Topics  . Alcohol use: Yes    Alcohol/week: 6.0 oz    Types: 10 Glasses of wine per week    Comment: 10 drinks a week  . Drug use: No     Allergies   Penicillins   Review of Systems Review of Systems  Constitutional: Negative for fatigue and fever.  Musculoskeletal: Negative for back pain, neck pain and stiffness.     Physical Exam Triage Vital Signs ED Triage Vitals  Enc Vitals Group     BP 02/07/18 0830  137/90     Pulse Rate 02/07/18 0830 81     Resp 02/07/18 0830 16     Temp 02/07/18 0830 98.2 F (36.8 C)     Temp Source 02/07/18 0830 Oral     SpO2 02/07/18 0830 100 %     Weight 02/07/18 0828 180 lb (81.6 kg)     Height 02/07/18 0828 5\' 9"  (1.753 m)     Head Circumference --      Peak Flow --      Pain Score 02/07/18 0828 3     Pain Loc --      Pain Edu? --      Excl. in GC? --    No data found.  Updated Vital Signs BP 137/90 (BP Location: Right Arm)   Pulse 81   Temp 98.2 F (36.8 C) (Oral)   Resp 16   Ht 5\' 9"  (1.753 m)   Wt 180 lb (81.6 kg)   SpO2 100%   BMI 26.58 kg/m   Visual Acuity Right Eye Distance:   Left Eye Distance:   Bilateral Distance:    Right Eye Near:   Left Eye Near:    Bilateral Near:     Physical Exam  Constitutional: He appears well-developed and well-nourished. No distress.  Musculoskeletal:       Left shoulder: He exhibits tenderness (over the trapezius and deltoid). He exhibits normal range of motion, no bony tenderness, no swelling, no effusion, no crepitus, no deformity, no laceration, normal pulse and normal strength.  Skin: He is not diaphoretic.  Nursing note and vitals reviewed.    UC Treatments / Results  Labs (all labs ordered are listed, but only abnormal results are displayed) Labs Reviewed - No data to display  EKG  EKG Interpretation None       Radiology No results found.  Procedures Procedures (including critical care time)  Medications Ordered in UC Medications - No data to display   Initial Impression / Assessment and Plan / UC Course  I have reviewed the triage vital signs and the nursing notes.  Pertinent labs & imaging results that were available during my care of the patient were reviewed by me and considered in my medical decision making (see chart for details).     Final Clinical Impressions(s) / UC Diagnoses   Final diagnoses:  Acute pain of left shoulder    ED Discharge Orders         Ordered    cyclobenzaprine (FLEXERIL) 10 MG tablet  3 times daily PRN     02/07/18 0855  1. diagnosis reviewed with patient 2. rx as per orders above; reviewed possible side effects, interactions, risks and benefits  3. Recommend supportive treatment with rest, ice/heat,  otc analgesics, stretches  4. Follow-up prn if symptoms worsen or don't improve  Controlled Substance Prescriptions [place] Controlled Substance Registry consulted? Not Applicable   Payton Mccallum, MD 02/07/18 1202

## 2018-02-07 NOTE — ED Triage Notes (Signed)
Patient c/o left shoulder pain that started yesterday.  Patient reports some some tingling in his hands.

## 2018-02-16 NOTE — Telephone Encounter (Signed)
Pt called in to check the status of his refill request. Please assist further.    CB: [phone]

## 2018-02-16 NOTE — Telephone Encounter (Signed)
LOV: 03/30/17  Dr. Para March  Pt picks up script at the office

## 2018-02-17 MED ORDER — AMPHETAMINE-DEXTROAMPHETAMINE 20 MG PO TABS: 20.0000 mg | ORAL_TABLET | Freq: Two times a day (BID) | ORAL | 0 refills | Status: DC

## 2018-02-17 NOTE — Telephone Encounter (Signed)
Printed.  Thanks.  

## 2018-02-17 NOTE — Telephone Encounter (Signed)
Left detailed message on voicemail. Rx left at front desk for pick up.  

## 2018-02-22 ENCOUNTER — Other Ambulatory Visit: Payer: Self-pay | Admitting: Family Medicine

## 2018-02-23 NOTE — Telephone Encounter (Signed)
Electronic refill request. Zolpidem Last office visit:   09/14/17 Last Filled:     30 tablet 1 12/13/2017  Please advise.

## 2018-02-24 ENCOUNTER — Encounter: Payer: Self-pay | Admitting: *Deleted

## 2018-02-24 NOTE — Telephone Encounter (Signed)
Letter mailed

## 2018-02-24 NOTE — Telephone Encounter (Signed)
Sent. Thanks.  Due for CPE. Please scheduled.

## 2018-03-02 DIAGNOSIS — M25511 Pain in right shoulder: Secondary | ICD-10-CM | POA: Diagnosis not present

## 2018-03-02 DIAGNOSIS — M9902 Segmental and somatic dysfunction of thoracic region: Secondary | ICD-10-CM | POA: Diagnosis not present

## 2018-03-02 DIAGNOSIS — M546 Pain in thoracic spine: Secondary | ICD-10-CM | POA: Diagnosis not present

## 2018-03-02 DIAGNOSIS — M9901 Segmental and somatic dysfunction of cervical region: Secondary | ICD-10-CM | POA: Diagnosis not present

## 2018-04-10 ENCOUNTER — Other Ambulatory Visit: Payer: Self-pay | Admitting: Family Medicine

## 2018-04-10 NOTE — Telephone Encounter (Signed)
Electronic refill request. Adderall Last office visit:   CPE 03/30/17  -  CPE scheduled 04/25/18 Last Filled:    60 tablet 0 02/17/2018  Please advise.

## 2018-04-10 NOTE — Telephone Encounter (Signed)
Sent. Thanks.   

## 2018-04-22 ENCOUNTER — Other Ambulatory Visit: Payer: Self-pay | Admitting: Family Medicine

## 2018-04-24 NOTE — Telephone Encounter (Signed)
Electronic refill request Last refill Alprazolam 10/01/17 #30 Last refill Zolpidem 02/24/18 #30/1

## 2018-04-25 NOTE — Telephone Encounter (Signed)
Sent. Thanks.   

## 2018-05-05 ENCOUNTER — Ambulatory Visit (INDEPENDENT_AMBULATORY_CARE_PROVIDER_SITE_OTHER): Payer: 59 | Admitting: Family Medicine

## 2018-05-05 ENCOUNTER — Encounter: Payer: Self-pay | Admitting: Family Medicine

## 2018-05-05 VITALS — BP 134/80 | HR 98 | Temp 97.7°F | Ht 68.25 in | Wt 188.4 lb

## 2018-05-05 DIAGNOSIS — R41840 Attention and concentration deficit: Secondary | ICD-10-CM

## 2018-05-05 DIAGNOSIS — Z7189 Other specified counseling: Secondary | ICD-10-CM

## 2018-05-05 DIAGNOSIS — Z131 Encounter for screening for diabetes mellitus: Secondary | ICD-10-CM | POA: Diagnosis not present

## 2018-05-05 DIAGNOSIS — Z125 Encounter for screening for malignant neoplasm of prostate: Secondary | ICD-10-CM | POA: Diagnosis not present

## 2018-05-05 DIAGNOSIS — Z0000 Encounter for general adult medical examination without abnormal findings: Secondary | ICD-10-CM

## 2018-05-05 DIAGNOSIS — G4700 Insomnia, unspecified: Secondary | ICD-10-CM

## 2018-05-05 DIAGNOSIS — F419 Anxiety disorder, unspecified: Secondary | ICD-10-CM

## 2018-05-05 LAB — PSA: PSA: 0.7 ng/mL (ref <=4.0)

## 2018-05-05 LAB — GLUCOSE, RANDOM: Glucose, Bld: 94 mg/dL (ref 65–99)

## 2018-05-05 MED ORDER — AMPHETAMINE-DEXTROAMPHETAMINE 20 MG PO TABS: 10.0000 mg | ORAL_TABLET | Freq: Two times a day (BID) | ORAL | Status: DC | PRN

## 2018-05-05 NOTE — Patient Instructions (Addendum)
Go to the lab on the way out.  We'll contact you with your lab report. Try to taper the adderall.  Update me as needed.  Try to work a little more exercise into your schedule. Take care.  Glad to see you.

## 2018-05-05 NOTE — Progress Notes (Signed)
CPE- See plan.  Routine anticipatory guidance given to patient.  See health maintenance.  The possibility exists that previously documented standard health maintenance information may have been brought forward from a previous encounter into this note.  If needed, that same information has been updated to reflect the current situation based on today's encounter.    Tetanus 2018 Flu encouraged PNA not due.  Shingles d/w pt.  PSA pending, d/w pt.   Colonoscopy 2017  Living will d/w pt. [name] designated if patient were incapacitated.  Diet and exercise d/w pt.  Encouraged.  "I could do a lot better."  Discussed options.   HIV neg prev per patient report, done in 2013.  HCV prev neg.    Concentration troubles.  On adderall with relief.  Doing "well, for the most part but I still get scattered." we talked about lowering his dose with taper.    Overall prn use of BZD with relief.  No ADE.  No SI/HI.    Insomnia.  D/w pt about options.  He has a steady bedtime.  He got a second fan for more white noise and that helped.  D/w pt about exercise.  He is splitting the ambien 5mg  early in the night with 5mg  later on if needed, that is tolerated with relief.    He had prev muscle spasms, flexeril didn't help.  He got better with ibuprofen.  No sx currently.    PMH and SH reviewed  Meds, vitals, and allergies reviewed.   ROS: Per HPI.  Unless specifically indicated otherwise in HPI, the patient denies:  General: fever. Eyes: acute vision changes ENT: sore throat Cardiovascular: chest pain Respiratory: SOB GI: vomiting GU: dysuria Musculoskeletal: acute back pain Derm: acute rash Neuro: acute motor dysfunction Psych: worsening mood Endocrine: polydipsia Heme: bleeding Allergy: hayfever  GEN: nad, alert and oriented HEENT: mucous membranes moist NECK: supple w/o LA CV: rrr. PULM: ctab, no inc wob ABD: soft, +bs EXT: no edema SKIN: no acute rash

## 2018-05-07 NOTE — Assessment & Plan Note (Signed)
Tetanus 2018 Flu encouraged PNA not due.  Shingles d/w pt.  PSA pending, d/w pt.   Colonoscopy 2017  Living will d/w pt. Mark Simon designated if patient were incapacitated.  Diet and exercise d/w pt.  Encouraged.  "I could do a lot better."  Discussed options.   HIV neg prev per patient report, done in 2013.  HCV prev neg.

## 2018-05-07 NOTE — Assessment & Plan Note (Signed)
D/w pt about options.  He has a steady bedtime.  He got a second fan for more white noise and that helped.  D/w pt about exercise.  He is splitting the ambien 5mg  early in the night with 5mg  later on if needed, that is tolerated with relief.

## 2018-05-07 NOTE — Assessment & Plan Note (Signed)
Overall prn use of BZD with relief.  No ADE.  No SI/HI.  Continue as is.  He agrees.

## 2018-05-07 NOTE — Assessment & Plan Note (Signed)
Living will d/w pt. Mark Simon designated if patient were incapacitated. 

## 2018-05-07 NOTE — Assessment & Plan Note (Signed)
On adderall with relief.  Doing "well, for the most part but I still get scattered." We talked about lowering his dose with taper, see after visit summary, discussed with patient about diet and exercise to help with concentration.

## 2018-05-24 ENCOUNTER — Other Ambulatory Visit: Payer: Self-pay | Admitting: Family Medicine

## 2018-05-24 NOTE — Telephone Encounter (Signed)
Name of Medication: Adderall Name of Pharmacy: [facility] Last Fill or Written Date and Quantity: 04/10/18 #60 Last Office Visit and Type:05/05/18 CPE  Next Office Visit and Type: None scheduled Last Controlled Substance Agreement Date: 05/06/17 Last UDS:05/09/17

## 2018-05-25 NOTE — Telephone Encounter (Signed)
Sent. Thanks.   

## 2018-06-24 ENCOUNTER — Other Ambulatory Visit: Payer: Self-pay | Admitting: Family Medicine

## 2018-06-26 NOTE — Telephone Encounter (Signed)
Name of Medication: Zolpidem Name of Pharmacy: [facility] [place] or Written Date and Quantity:  30 tablet 1 04/25/2018  Last Office Visit and Type: 05/05/18 CPE Next Office Visit and Type:  Last Controlled Substance Agreement Date: 10/17/15 Last UDS: 05/09/17

## 2018-06-26 NOTE — Telephone Encounter (Signed)
Sent. Thanks.   

## 2018-06-30 ENCOUNTER — Other Ambulatory Visit: Payer: Self-pay | Admitting: Family Medicine

## 2018-06-30 NOTE — Telephone Encounter (Signed)
Name of Medication: Adderall Name of Pharmacy: [facility] Last Fill or Written Date and Quantity:  60 tablet 0 05/25/2018  Last Office Visit and Type: 05/05/18 CPE Next Office Visit and Type: None Last Controlled Substance Agreement Date:10/17/15  Last UDS: 05/09/17

## 2018-07-02 NOTE — Telephone Encounter (Signed)
Sent. Thanks.   

## 2018-07-13 ENCOUNTER — Encounter: Payer: Self-pay | Admitting: Family Medicine

## 2018-07-13 ENCOUNTER — Encounter

## 2018-07-13 ENCOUNTER — Ambulatory Visit (INDEPENDENT_AMBULATORY_CARE_PROVIDER_SITE_OTHER): Payer: 59 | Admitting: Family Medicine

## 2018-07-13 DIAGNOSIS — R202 Paresthesia of skin: Secondary | ICD-10-CM | POA: Diagnosis not present

## 2018-07-13 DIAGNOSIS — M25519 Pain in unspecified shoulder: Secondary | ICD-10-CM | POA: Diagnosis not present

## 2018-07-13 NOTE — Patient Instructions (Signed)
Likely rotator cuff irritation.  Try the home exercises.  Likely carpal tunnel irritation.  Try a hard wrist splint at night.   If not better then let me know.   Take care.  Glad to see you.

## 2018-07-13 NOTE — Progress Notes (Signed)
His shoulder pain is worse in the meantime, more pain for longer episodes. L shoulder pain with ROM, esp overhead motions.  Now with B sx, but L>R.  Pain laying on side at night.  No trauma.    He had some isolated knee and hip pain that seem to be better but the shoulder pain is clearly ongoing.    He has some numbness in the R hand, noted with writing, operating a phone, when sleeping.  R handed.    No rash, no head, no vomiting, no diarrhea, no cough. He doesn't feel sick.   Has been taking exceedrin for pain with some relief.  Prev was taking tylenol w/o relief.    Meds, vitals, and allergies reviewed.   ROS: Per HPI unless specifically indicated in ROS section   nad ncat Neck supple, no LA, normal ROM rrr ctab B shoulder pain on ROM.  No arm drop.  Supraspinatus testing not tender.  He has minimal impingement on the left.  Not noted on the right.  Normal grip distally.  Right hand with normal sensation but right wrist Tinel is clearly positive. abd soft Ext w/o edema  no rash.

## 2018-07-16 DIAGNOSIS — M25519 Pain in unspecified shoulder: Secondary | ICD-10-CM | POA: Insufficient documentation

## 2018-07-16 DIAGNOSIS — R202 Paresthesia of skin: Secondary | ICD-10-CM | POA: Insufficient documentation

## 2018-07-16 NOTE — Assessment & Plan Note (Signed)
Likely rotator cuff irritation.  Try the home exercises-handout given to patient, discussed, demonstrated, all questions answered. If not better then let me know.  We can refer to orthopedics if needed.

## 2018-07-16 NOTE — Assessment & Plan Note (Addendum)
Likely carpal tunnel irritation.  Try a hard wrist splint at night.   If not better then let me know.   Rationale discussed with patient.  We consider the hand clinic if needed in the future.  He agrees.  Anatomy discussed with patient. >25 minutes spent in face to face time with patient, >50% spent in counselling or coordination of care.

## 2018-07-19 ENCOUNTER — Other Ambulatory Visit: Payer: Self-pay | Admitting: Family Medicine

## 2018-07-19 NOTE — Telephone Encounter (Signed)
Sent. Thanks.   

## 2018-07-19 NOTE — Telephone Encounter (Signed)
Name of Medication: Alprazolam Name of Pharmacy: [facility] Last Fill or Written Date and Quantity: 04/25/18 #30 Last Office Visit and Type: 07/13/18/acute Next Office Visit and Type: None scheduled Last Controlled Substance Agreement Date: 05/06/17 Last UDS: 05/09/17

## 2018-08-03 ENCOUNTER — Other Ambulatory Visit: Payer: Self-pay | Admitting: Family Medicine

## 2018-08-03 NOTE — Telephone Encounter (Signed)
Name of Medication: Adderall Name of Pharmacy: [facility] Last Fill or Written Date and Quantity:  60 tablet 0 07/02/2018  Last Office Visit and Type: 07/13/18 Acute Next Office Visit and Type: None Last Controlled Substance Agreement Date: 10/17/15 Last UDS: 05/09/17

## 2018-08-04 NOTE — Telephone Encounter (Signed)
Sent. Thanks.   

## 2018-08-29 ENCOUNTER — Other Ambulatory Visit: Payer: Self-pay | Admitting: Family Medicine

## 2018-08-30 NOTE — Telephone Encounter (Signed)
Sent. Thanks.   

## 2018-08-30 NOTE — Telephone Encounter (Signed)
Name of Medication: Zolpidem Name of Pharmacy: [facility] Last Fill or Written Date and Quantity: 07/19/18 #30/1 Last Office Visit and Type:  07/13/18 acute Next Office Visit and Type:None scheduled Last Controlled Substance Agreement Date: 05/06/17 Last UDS: 05/09/17

## 2018-09-27 ENCOUNTER — Other Ambulatory Visit: Payer: Self-pay | Admitting: Family Medicine

## 2018-09-27 NOTE — Telephone Encounter (Signed)
Name of Medication: Adderall Name of Pharmacy: [facility] Last Fill or Written Date and Quantity: 08/04/18 acute Last Office Visit and Type: 07/13/18 acute Next Office Visit and Type: none scheduled Last Controlled Substance Agreement Date: 61/18 Last UDS:05/09/17

## 2018-09-27 NOTE — Telephone Encounter (Signed)
Name of Medication: Alprazolam Name of Pharmacy: [facility] Last Fill or Written Date and Quantity: 07/19/18 #30/1 Last Office Visit and Type: 07/13/18 acute Next Office Visit and Type: none scheduled Last Controlled Substance Agreement Date: 05/06/17 Last UDS:05/09/18

## 2018-09-28 MED ORDER — ALPRAZOLAM 0.25 MG PO TABS: ORAL_TABLET | ORAL | 1 refills | Status: DC

## 2018-09-28 NOTE — Telephone Encounter (Signed)
Sent. Thanks.   

## 2018-10-18 ENCOUNTER — Other Ambulatory Visit: Payer: Self-pay

## 2018-10-18 ENCOUNTER — Ambulatory Visit
Admission: EM | Admit: 2018-10-18 | Discharge: 2018-10-18 | Disposition: A | Discharge status: Home / Self Care | Payer: 59 | Attending: Family Medicine | Admitting: Family Medicine

## 2018-10-18 DIAGNOSIS — M654 Radial styloid tenosynovitis [de Quervain]: Secondary | ICD-10-CM

## 2018-10-18 DIAGNOSIS — G5602 Carpal tunnel syndrome, left upper limb: Secondary | ICD-10-CM

## 2018-10-18 DIAGNOSIS — M779 Enthesopathy, unspecified: Secondary | ICD-10-CM | POA: Diagnosis not present

## 2018-10-18 NOTE — ED Provider Notes (Signed)
[facility]    CSN: [id number] Arrival date & time: 10/18/18  0805     History   Chief Complaint Chief Complaint  Patient presents with  . Hand Pain    bilateral    HPI Mark Simon is a 52 y.o. male.   52 yo male with a c/o several months h/o left hand palm pain and intermittent numbness/tingling, worse over the last several weeks and several weeks h/o back of right thumb pain and right wrist pain. States he bought a carpal tunnel wrist splint which he's been alternating on both hands. Denies any falls or other traumatic injury.   The history is provided by the patient.  Hand Pain     Past Medical History:  Diagnosis Date  . ADD (attention deficit disorder)   . Allergy   . Chicken pox   . Claustrophobia   . Depression   . Epicondylitis   . Forearm fracture   . Lower leg fracture    hx mult fx    Patient Active Problem List   Diagnosis Date Noted  . Shoulder pain 07/16/2018  . Hand paresthesia 07/16/2018  . Special screening for malignant neoplasms, colon   . FH: prostate cancer 02/10/2015  . Routine general medical examination at a health care facility 01/23/2015  . Advance care planning 01/23/2015  . Anxiety 08/15/2014  . Difficulty concentrating 08/15/2014  . Insomnia 08/15/2014  . Erectile dysfunction 08/15/2014    Past Surgical History:  Procedure Laterality Date  . COLONOSCOPY WITH PROPOFOL N/A 03/08/2016   Procedure: COLONOSCOPY WITH PROPOFOL;  Surgeon: Midge Minium, MD;  Location: [facility];  Service: Endoscopy;  Laterality: N/A;  . FEMUR SURGERY Left 1974   pinned after fracture  . INGUINAL HERNIA REPAIR     [id number]  . TOOTH EXTRACTION         Home Medications    Prior to Admission medications   Medication Sig Start Date End Date Taking? Authorizing Provider  ALPRAZolam Prudy Feeler) 0.25 MG tablet TAKE 1/2 TABLET BY MOUTH TWICE DAILY AS NEEDED 09/28/18  Yes Joaquim Nam, MD  amphetamine-dextroamphetamine  (ADDERALL) 20 MG tablet TAKE 1 TABLET BY MOUTH TWICE DAILY 09/28/18  Yes Joaquim Nam, MD  fexofenadine (ALLEGRA) 180 MG tablet Take 180 mg by mouth daily as needed for allergies or rhinitis.   Yes [provider]  Multiple Vitamin (MULTIVITAMIN) capsule Take 1 capsule by mouth daily.   Yes [provider]  zolpidem (AMBIEN) 10 MG tablet TAKE 1 TABLET BY MOUTH EVERYDAY AT BEDTIME 08/30/18  Yes Joaquim Nam, MD    Family History Family History  Problem Relation Age of Onset  . Asthma Mother   . Migraines Mother   . Cancer Father        Prostate  . Epilepsy Father   . Prostate cancer Father   . Cancer Paternal Grandmother        Colon  . Stroke Paternal Grandmother   . Colon cancer Paternal Grandmother   . Heart disease Paternal Grandfather     Social History Social History   Tobacco Use  . Smoking status: Never Smoker  . Smokeless tobacco: Never Used  Substance Use Topics  . Alcohol use: Yes    Alcohol/week: 10.0 standard drinks    Types: 10 Glasses of wine per week    Comment: 5-6 drinks per week  . Drug use: No     Allergies   Penicillins and Flexeril [cyclobenzaprine]   Review  of Systems Review of Systems   Physical Exam Triage Vital Signs ED Triage Vitals  Enc Vitals Group     BP 10/18/18 0818 (!) 141/100     Pulse Rate 10/18/18 0818 87     Resp 10/18/18 0818 18     Temp 10/18/18 0818 98.2 F (36.8 C)     Temp Source 10/18/18 0818 Oral     SpO2 10/18/18 0818 100 %     Weight 10/18/18 0816 175 lb (79.4 kg)     Height 10/18/18 0816 5\' 9"  (1.753 m)     Head Circumference --      Peak Flow --      Pain Score 10/18/18 0815 9     Pain Loc --      Pain Edu? --      Excl. in GC? --    No data found.  Updated Vital Signs BP (!) 141/100 (BP Location: Left Arm)   Pulse 87   Temp 98.2 F (36.8 C) (Oral)   Resp 18   Ht 5\' 9"  (1.753 m)   Wt 79.4 kg   SpO2 100%   BMI 25.84 kg/m   Visual Acuity Right Eye Distance:   Left Eye  Distance:   Bilateral Distance:    Right Eye Near:   Left Eye Near:    Bilateral Near:     Physical Exam  Constitutional: He appears well-developed and well-nourished. No distress.  Musculoskeletal:       Right hand: He exhibits tenderness (over the dorsal aspect of thumb, along the tendons). He exhibits no bony tenderness. Normal sensation noted. Normal strength noted.       Left hand: He exhibits no bony tenderness, no deformity, no laceration and no swelling. Normal sensation noted. Normal strength noted.  Left hand positive Phalen's test  Skin: He is not diaphoretic.  Nursing note and vitals reviewed.    UC Treatments / Results  Labs (all labs ordered are listed, but only abnormal results are displayed) Labs Reviewed - No data to display  EKG None  Radiology No results found.  Procedures Procedures (including critical care time)  Medications Ordered in UC Medications - No data to display  Initial Impression / Assessment and Plan / UC Course  I have reviewed the triage vital signs and the nursing notes.  Pertinent labs & imaging results that were available during my care of the patient were reviewed by me and considered in my medical decision making (see chart for details).      Final Clinical Impressions(s) / UC Diagnoses   Final diagnoses:  Carpal tunnel syndrome, left  De Quervain's tenosynovitis, right     Discharge Instructions     Ibuprofen 600mg  three times daily or naproxen (Aleve) 2 tablets twice daily    ED Prescriptions    None      1.diagnoses reviewed with patient 2. Recommend supportive treatment as above  3. Thumb spica splint for right hand; continue use of carpal tunnel wrist splint for left hand 4.Follow-up prn if symptoms worsen or don't improve   Controlled Substance Prescriptions [place] Controlled Substance Registry consulted? Not Applicable   10/20/18, MD 10/18/18 1325

## 2018-10-18 NOTE — ED Triage Notes (Signed)
Patient complains of possible bilateral carpal tunnel. Patient states that he has pain that shoots from the palm to his fingers. Patient states that the pain has been constant, worse on left hand last night.

## 2018-10-18 NOTE — Discharge Instructions (Signed)
Ibuprofen 600mg  three times daily or naproxen (Aleve) 2 tablets twice daily

## 2018-11-04 ENCOUNTER — Other Ambulatory Visit: Payer: Self-pay | Admitting: Family Medicine

## 2018-11-06 NOTE — Telephone Encounter (Signed)
Sent. Thanks.   

## 2018-11-06 NOTE — Telephone Encounter (Signed)
Name of Medication: Adderall Name of Pharmacy: [facility] Last Fill or Written Date and Quantity: 08/30/18 #30/1 Last Office Visit and Type: 07/13/18 Next Office Visit and Type: none scheduled Last Controlled Substance Agreement Date: 05/06/17 Last UDS:05/06/17

## 2018-11-07 ENCOUNTER — Other Ambulatory Visit: Payer: Self-pay | Admitting: Family Medicine

## 2018-11-07 NOTE — Telephone Encounter (Signed)
Electronic refill request. Adderall Last office visit:   07/13/2018 Last Filled:     60 tablet 0 09/28/2018  Please advise.

## 2018-11-08 NOTE — Telephone Encounter (Signed)
Sent. Thanks.   

## 2018-12-13 ENCOUNTER — Other Ambulatory Visit: Payer: Self-pay | Admitting: Family Medicine

## 2018-12-13 NOTE — Telephone Encounter (Signed)
Sent. Thanks.   

## 2018-12-13 NOTE — Telephone Encounter (Signed)
Last office visit 07/13/2018 for Pain in joints.  Last refilled Alprazolam 09/28/2018 for #30 with 1 refill.  Adderall 11/08/2018 for #60 with no refills.  No future appointments.  UDS/Contract 05/09/2017.

## 2019-01-10 ENCOUNTER — Other Ambulatory Visit: Payer: Self-pay | Admitting: Family Medicine

## 2019-01-11 NOTE — Telephone Encounter (Signed)
Last office visit 07/13/2018 for shoulder pain.  Last refilled 11/06/18 for 30 with 1 refill.  No future appointments.

## 2019-01-12 NOTE — Telephone Encounter (Signed)
Sent. Thanks.   

## 2019-01-30 ENCOUNTER — Other Ambulatory Visit: Payer: Self-pay | Admitting: Family Medicine

## 2019-01-30 NOTE — Telephone Encounter (Signed)
Electronic refill request Adderall Last refill 12/13/18 #60 Last office visit 07/13/18 No upcoming appointment scheduled

## 2019-01-31 NOTE — Telephone Encounter (Signed)
Sent. Thanks.   

## 2019-02-07 ENCOUNTER — Telehealth: Payer: Self-pay

## 2019-02-07 NOTE — Telephone Encounter (Signed)
Pt calling; last year had similar episode of pain and numbness in fingers for approx 2 days and then just went away. For the past 3 days numbness and pain in rt fingers have returned with pain level 5-8. Pt having difficulty in gripping with rt hand. No slurred speech or difficulty walking. 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 No available appts today and pt declined appt at another [facility] site or UC. Pt scheduled appt with Dr Para March 02/08/19 at 8 AM and if condition worsens prior to appt pt agreed to go to [facility]. FYI to Dr Para March.

## 2019-02-07 NOTE — Telephone Encounter (Signed)
Noted. Thanks. Will see at OV.  

## 2019-02-08 ENCOUNTER — Ambulatory Visit (INDEPENDENT_AMBULATORY_CARE_PROVIDER_SITE_OTHER): Payer: 59 | Admitting: Family Medicine

## 2019-02-08 ENCOUNTER — Encounter: Payer: Self-pay | Admitting: Family Medicine

## 2019-02-08 DIAGNOSIS — M779 Enthesopathy, unspecified: Secondary | ICD-10-CM

## 2019-02-08 DIAGNOSIS — M778 Other enthesopathies, not elsewhere classified: Secondary | ICD-10-CM

## 2019-02-08 DIAGNOSIS — G5600 Carpal tunnel syndrome, unspecified upper limb: Secondary | ICD-10-CM | POA: Diagnosis not present

## 2019-02-08 NOTE — Progress Notes (Signed)
R hand sx.  He had prev shoulder and hand pain.  He has had recurrent R thumb pain, along the extensor sheath.  Also with separate pain/numbness along the palmar side, radiating down the R 3rd finger, causing trouble with writing and typing.  Has used prev braces w/o relief, worn at night since he can't work in them.  He tried aleve and Excedrin, aleve seemed to helpl a little.    Meds, vitals, and allergies reviewed.   ROS: Per HPI unless specifically indicated in ROS section   nad ncat Normal right shoulder range of motion. Normal right elbow range of motion. Normal wrist range of motion. De Quervain's testing positive on the right thumb consistent with an extensor thumb tendinitis.  Distally neurovascularly intact on left thumb. R wrist tinel positive with radiation down the right third finger, on the palmar side. Sensation and vibration intact on the hand across the fingers. No rash or bruising or swelling.

## 2019-02-08 NOTE — Patient Instructions (Signed)
Take 1 aleve twice a day with food for 10 days.  Use the right wrist splint each night.   We can set you up with the hand clinic if needed.  Take care.  Glad to see you.

## 2019-02-09 ENCOUNTER — Ambulatory Visit: Payer: 59 | Admitting: Family Medicine

## 2019-02-11 DIAGNOSIS — G5600 Carpal tunnel syndrome, unspecified upper limb: Secondary | ICD-10-CM | POA: Insufficient documentation

## 2019-02-11 DIAGNOSIS — M779 Enthesopathy, unspecified: Principal | ICD-10-CM

## 2019-02-11 DIAGNOSIS — M778 Other enthesopathies, not elsewhere classified: Secondary | ICD-10-CM | POA: Insufficient documentation

## 2019-02-11 NOTE — Assessment & Plan Note (Signed)
Likely to concurrent but separate issues, right thumb tendinitis and right carpal tunnel syndrome.  Discussed with patient about options. Take 1 aleve twice a day with food for 10 days.  Use the right spica splint each night.   We can refer to the hand clinic if needed. He agrees.

## 2019-02-11 NOTE — Assessment & Plan Note (Signed)
Likely to concurrent but separate issues, right thumb tendinitis and right carpal tunnel syndrome.  Discussed with patient about options. Take 1 aleve twice a day with food for 10 days.  Use the right spica splint each night.   We can refer to the hand clinic if needed. He agrees.   

## 2019-03-12 ENCOUNTER — Other Ambulatory Visit: Payer: Self-pay | Admitting: Family Medicine

## 2019-03-13 NOTE — Telephone Encounter (Signed)
Electronic refill request Ambien Last office visit 02/08/19 Last refill 12/13/18 #30/81

## 2019-03-14 NOTE — Telephone Encounter (Signed)
Sent. Thanks.   

## 2019-03-26 ENCOUNTER — Other Ambulatory Visit: Payer: Self-pay | Admitting: Family Medicine

## 2019-03-26 NOTE — Telephone Encounter (Signed)
Received request for refill of ADHD Medication.  Last refill date of Adderall was 01/31/19 Medication:Adderall 20 mg bid # dispensed: 60 CSC: 05/06/17 UDS: 05/09/17  LOV: 02/08/19 Acute Next OV: 05/18/19  Please advise

## 2019-03-27 NOTE — Telephone Encounter (Signed)
Sent. Thanks.   

## 2019-04-10 ENCOUNTER — Other Ambulatory Visit: Payer: Self-pay | Admitting: Family Medicine

## 2019-04-11 NOTE — Telephone Encounter (Signed)
Sent. Thanks.   

## 2019-04-11 NOTE — Telephone Encounter (Signed)
Electronic refill request Alprazolam Last refill 12/13/18 #30/1 Last office visit 02/08/19

## 2019-05-09 ENCOUNTER — Other Ambulatory Visit (INDEPENDENT_AMBULATORY_CARE_PROVIDER_SITE_OTHER): Payer: 59

## 2019-05-09 ENCOUNTER — Other Ambulatory Visit: Payer: Self-pay | Admitting: Family Medicine

## 2019-05-09 DIAGNOSIS — Z13220 Encounter for screening for lipoid disorders: Secondary | ICD-10-CM

## 2019-05-09 DIAGNOSIS — Z125 Encounter for screening for malignant neoplasm of prostate: Secondary | ICD-10-CM

## 2019-05-09 DIAGNOSIS — Z131 Encounter for screening for diabetes mellitus: Secondary | ICD-10-CM

## 2019-05-09 LAB — LIPID PANEL
Cholesterol: 168 mg/dL (ref 0–200)
HDL: 55.6 mg/dL (ref >39.00)
LDL Cholesterol: 98 mg/dL (ref 0–99)
NonHDL: 112.1
Total CHOL/HDL Ratio: 3
Triglycerides: 72 mg/dL (ref 0.0–149.0)
VLDL: 14.4 mg/dL (ref 0.0–40.0)

## 2019-05-09 LAB — BASIC METABOLIC PANEL
BUN: 8 mg/dL (ref 6–23)
CO2: 28 mEq/L (ref 19–32)
Calcium: 8.9 mg/dL (ref 8.4–10.5)
Chloride: 103 mEq/L (ref 96–112)
Creatinine, Ser: 0.96 mg/dL (ref 0.40–1.50)
GFR: 81.46 mL/min (ref >60.00)
Glucose, Bld: 100 mg/dL — ABNORMAL HIGH (ref 70–99)
Potassium: 4 mEq/L (ref 3.5–5.1)
Sodium: 137 mEq/L (ref 135–145)

## 2019-05-09 LAB — PSA: PSA: 0.8 ng/mL (ref 0.10–4.00)

## 2019-05-11 ENCOUNTER — Other Ambulatory Visit: Payer: 59

## 2019-05-18 ENCOUNTER — Other Ambulatory Visit: Payer: Self-pay

## 2019-05-18 ENCOUNTER — Encounter: Payer: Self-pay | Admitting: Family Medicine

## 2019-05-18 ENCOUNTER — Ambulatory Visit (INDEPENDENT_AMBULATORY_CARE_PROVIDER_SITE_OTHER): Payer: 59 | Admitting: Family Medicine

## 2019-05-18 VITALS — BP 140/90 | HR 82 | Temp 98.4°F | Resp 18 | Ht 68.25 in | Wt 187.6 lb

## 2019-05-18 DIAGNOSIS — Z0000 Encounter for general adult medical examination without abnormal findings: Secondary | ICD-10-CM | POA: Diagnosis not present

## 2019-05-18 DIAGNOSIS — F419 Anxiety disorder, unspecified: Secondary | ICD-10-CM

## 2019-05-18 DIAGNOSIS — G4700 Insomnia, unspecified: Secondary | ICD-10-CM

## 2019-05-18 DIAGNOSIS — Z7189 Other specified counseling: Secondary | ICD-10-CM

## 2019-05-18 DIAGNOSIS — R41840 Attention and concentration deficit: Secondary | ICD-10-CM

## 2019-05-18 DIAGNOSIS — R202 Paresthesia of skin: Secondary | ICD-10-CM

## 2019-05-18 MED ORDER — AMPHETAMINE-DEXTROAMPHETAMINE 20 MG PO TABS: 20.0000 mg | ORAL_TABLET | Freq: Two times a day (BID) | ORAL | Status: DC

## 2019-05-18 NOTE — Progress Notes (Signed)
CPE- See plan.  Routine anticipatory guidance given to patient.  See health maintenance.  The possibility exists that previously documented standard health maintenance information may have been brought forward from a previous encounter into this note.  If needed, that same information has been updated to reflect the current situation based on today's encounter.    Tetanus2018 Flu encouraged PNA not due.  Shingles d/w pt. Out of stock.  PSA wnl.   Colonoscopy 2017  Living will d/w pt. Romelle Starcher designated if patient were incapacitated.  Diet and exercise d/w pt.  Encouraged. "I could do a lot better." Discussed options.   HIV neg prev per patient report, done in 2013.  HCV prev neg.      His hand sx are better.  He splinted and did well.    Insomnia. Taking Lorrin Mais but he is still waking at night some.  Trouble falling asleep, partner snores.  He has occ had to change rooms.  Taking 1/2 tab usually with occ 1/2 tab later in the night if needed. Discussed parasomnia cautions.  Discussed limiting technology- he is doing well with that- prior to bed.  Discussed diet and exercise.    ADD.  Some dec in appetite.  He is still functional on the days w/o use.  Discussed possible contribution to insomnia.    Anxiety.  Social stressors noted, pandemic discussed.  Some apathy but not SI/HI.  Unclear how much his sleep affects his mood.    Pandemic considerations d/w pt.    PMH and SH reviewed  Meds, vitals, and allergies reviewed.   ROS: Per HPI.  Unless specifically indicated otherwise in HPI, the patient denies:  General: fever. Eyes: acute vision changes ENT: sore throat Cardiovascular: chest pain Respiratory: SOB GI: vomiting GU: dysuria Musculoskeletal: acute back pain Derm: acute rash Neuro: acute motor dysfunction Psych: worsening mood Endocrine: polydipsia Heme: bleeding Allergy: hayfever  GEN: nad, alert and oriented HEENT: ncat NECK: supple w/o LA CV: rrr. PULM:  ctab, no inc wob ABD: soft, +bs EXT: minimal/trace BLE edema SKIN: no acute rash

## 2019-05-18 NOTE — Patient Instructions (Addendum)
Stop the adderall for now update me in about 1 week about your mood and sleep and concentration.  Try to work on some exercise, when possible.  Update me as needed.  Take care.  Glad to see you.

## 2019-05-20 NOTE — Assessment & Plan Note (Addendum)
Taking Lorrin Mais but he is still waking at night some.  Trouble falling asleep, partner snores.  He has occ had to change rooms.  Taking 1/2 tab usually with occ 1/2 tab later in the night if needed. Discussed parasomnia cautions.  Discussed limiting technology- he is doing well with that- prior to bed.  Discussed diet and exercise.   He will stop Adderall for now and see if he notes a change in concentration or improvement in sleep.

## 2019-05-20 NOTE — Assessment & Plan Note (Signed)
Living will d/w pt. Mark Simon designated if patient were incapacitated. 

## 2019-05-20 NOTE — Assessment & Plan Note (Signed)
Social stressors noted, pandemic discussed.  Some apathy but not SI/HI.  Unclear how much his sleep affects his mood.   He will stop his Adderall for now and see if that improves his sleep and see if that improves his mood.  Discussed.  He agrees.

## 2019-05-20 NOTE — Assessment & Plan Note (Signed)
Some dec in appetite.  He is still functional on the days w/o use.  Discussed possible contribution to insomnia.  He will stop Adderall for now and see if he notes a change in concentration or improvement in sleep.

## 2019-05-20 NOTE — Assessment & Plan Note (Signed)
Tetanus2018 Flu encouraged PNA not due.  Shingles d/w pt. Out of stock.  PSA wnl.   Colonoscopy 2017  Living will d/w pt. Mark Simon designated if patient were incapacitated.  Diet and exercise d/w pt.  Encouraged. "I could do a lot better." Discussed options.   HIV neg prev per patient report, done in 2013.  HCV prev neg.

## 2019-05-20 NOTE — Assessment & Plan Note (Signed)
His hand sx are better.  He splinted and did well.

## 2019-05-21 ENCOUNTER — Other Ambulatory Visit: Payer: Self-pay | Admitting: Family Medicine

## 2019-05-22 NOTE — Telephone Encounter (Signed)
Electronic refill request. Zolpidem Last office visit:   05/18/2019 Last Filled:    30 tablet 1 03/14/2019  Please advise.

## 2019-05-23 NOTE — Telephone Encounter (Signed)
Sent. Thanks.   

## 2019-06-21 ENCOUNTER — Other Ambulatory Visit: Payer: Self-pay | Admitting: Family Medicine

## 2019-06-22 NOTE — Telephone Encounter (Signed)
Electronic refill request. Alprazolam Last office visit:   05/18/2019 Last Filled:    30 tablet 1 04/11/2019  Please advise.

## 2019-06-24 NOTE — Telephone Encounter (Signed)
Sent. Thanks.   

## 2019-07-30 ENCOUNTER — Other Ambulatory Visit: Payer: Self-pay | Admitting: Family Medicine

## 2019-07-31 NOTE — Telephone Encounter (Signed)
Electronic refill request. Zolpidem Last office visit:   05/18/2019 Last Filled:   Dispense: 30 tablet Refills: 1 Start: 05/23/2019 Please advise.

## 2019-08-01 NOTE — Telephone Encounter (Signed)
Sent. Thanks.   

## 2019-09-05 ENCOUNTER — Other Ambulatory Visit: Payer: Self-pay | Admitting: Family Medicine

## 2019-09-05 NOTE — Telephone Encounter (Signed)
Sent. Thanks.   

## 2019-09-05 NOTE — Telephone Encounter (Signed)
Electronic refill request Alprazolam Last office visit 05/18/19 Last refill 06/24/19 #30/1

## 2019-10-07 ENCOUNTER — Other Ambulatory Visit: Payer: Self-pay | Admitting: Family Medicine

## 2019-10-08 NOTE — Telephone Encounter (Signed)
Electronic refill request. Zolpidem Last office visit:   05/18/2019 CPE Last Filled:    30 tablet 1 08/01/2019  Please advise.

## 2019-10-08 NOTE — Telephone Encounter (Signed)
Sent. Thanks.   

## 2019-12-11 ENCOUNTER — Other Ambulatory Visit: Payer: Self-pay | Admitting: Family Medicine

## 2019-12-12 NOTE — Telephone Encounter (Signed)
Sent. Thanks.   

## 2019-12-12 NOTE — Telephone Encounter (Signed)
Name of Medication: Alprazolam and Zolpidem Name of Pharmacy: [facility] or Written Date and Quantity: Alprazolam 09/05/2019 #30 with 1 refill,        Zolpidem 10/08/2019 #30 with 1 refill Last Office Visit and Type: LOV 05/18/2019 CPE Next Office Visit and Type: none Last Controlled Substance Agreement Date: 05/06/17 Last UDS: 05/09/17

## 2020-01-18 DIAGNOSIS — H5213 Myopia, bilateral: Secondary | ICD-10-CM | POA: Diagnosis not present

## 2020-02-14 ENCOUNTER — Other Ambulatory Visit: Payer: Self-pay | Admitting: Family Medicine

## 2020-02-15 NOTE — Telephone Encounter (Signed)
Electronic refill request. Alprazolam Last office visit:   05/18/2019 Last Filled:    30 tablet 1 12/12/2019  Please advise.   Electronic refill request. Zolpidem Last office visit:   05/18/2019 Last Filled:    30 tablet 1 12/12/2019  Please advise.

## 2020-02-17 NOTE — Telephone Encounter (Signed)
Sent. Thanks.   

## 2020-04-01 ENCOUNTER — Other Ambulatory Visit: Payer: Self-pay | Admitting: Family Medicine

## 2020-04-01 NOTE — Telephone Encounter (Signed)
Patient called to get a refill on Adderal. Dr.Duncan and patient tried stopping medication to see how he would do without medication.  Patient said he could tell a big difference after about 8 months with focus. Patient uses [facility]. Patient said he can wait until Dr.Duncan returns.

## 2020-04-03 MED ORDER — AMPHETAMINE-DEXTROAMPHETAMINE 10 MG PO TABS: 10.0000 mg | ORAL_TABLET | Freq: Two times a day (BID) | ORAL | 0 refills | Status: DC

## 2020-04-03 NOTE — Telephone Encounter (Signed)
Patient advised.

## 2020-04-03 NOTE — Telephone Encounter (Signed)
Duly noted.  I sent the prescription.  I would not restart it back at 20 mg twice a day if he has been off the medication for a while.  I would restart at 10 mg twice a day and see how he does with that.  Please update me as needed.  Thanks.

## 2020-04-16 ENCOUNTER — Other Ambulatory Visit: Payer: Self-pay | Admitting: Family Medicine

## 2020-04-16 NOTE — Telephone Encounter (Signed)
Refill request Zolpidem Last office visit 05/18/19 Upcoming appointment 05/30/20 Last refill 02/17/20 #30/1

## 2020-05-09 ENCOUNTER — Other Ambulatory Visit: Payer: Self-pay

## 2020-05-09 ENCOUNTER — Other Ambulatory Visit (INDEPENDENT_AMBULATORY_CARE_PROVIDER_SITE_OTHER): Payer: 59

## 2020-05-09 ENCOUNTER — Other Ambulatory Visit: Payer: Self-pay | Admitting: Family Medicine

## 2020-05-09 DIAGNOSIS — G4700 Insomnia, unspecified: Secondary | ICD-10-CM

## 2020-05-09 DIAGNOSIS — Z125 Encounter for screening for malignant neoplasm of prostate: Secondary | ICD-10-CM

## 2020-05-09 LAB — BASIC METABOLIC PANEL
BUN: 8 mg/dL (ref 6–23)
CO2: 28 mEq/L (ref 19–32)
Calcium: 9.3 mg/dL (ref 8.4–10.5)
Chloride: 100 mEq/L (ref 96–112)
Creatinine, Ser: 0.93 mg/dL (ref 0.40–1.50)
GFR: 84.19 mL/min (ref >60.00)
Glucose, Bld: 100 mg/dL — ABNORMAL HIGH (ref 70–99)
Potassium: 4 mEq/L (ref 3.5–5.1)
Sodium: 135 mEq/L (ref 135–145)

## 2020-05-09 LAB — PSA: PSA: 0.65 ng/mL (ref 0.10–4.00)

## 2020-05-22 ENCOUNTER — Other Ambulatory Visit: Payer: Self-pay | Admitting: Family Medicine

## 2020-05-22 NOTE — Telephone Encounter (Signed)
Electronic refill request. Alprazolam Last office visit:   05/18/2019 Last Filled:    30 tablet 1 02/17/2020  Please advise.

## 2020-05-23 ENCOUNTER — Other Ambulatory Visit: Payer: 59

## 2020-05-23 NOTE — Telephone Encounter (Signed)
Sent. Thanks.  Has follow-up pending 

## 2020-05-30 ENCOUNTER — Other Ambulatory Visit: Payer: Self-pay

## 2020-05-30 ENCOUNTER — Encounter: Payer: Self-pay | Admitting: Family Medicine

## 2020-05-30 ENCOUNTER — Ambulatory Visit (INDEPENDENT_AMBULATORY_CARE_PROVIDER_SITE_OTHER): Payer: 59 | Admitting: Family Medicine

## 2020-05-30 VITALS — BP 110/68 | HR 77 | Temp 97.0°F | Ht 68.25 in | Wt 191.2 lb

## 2020-05-30 DIAGNOSIS — Z7189 Other specified counseling: Secondary | ICD-10-CM

## 2020-05-30 DIAGNOSIS — Z0000 Encounter for general adult medical examination without abnormal findings: Secondary | ICD-10-CM

## 2020-05-30 DIAGNOSIS — F419 Anxiety disorder, unspecified: Secondary | ICD-10-CM

## 2020-05-30 DIAGNOSIS — R41840 Attention and concentration deficit: Secondary | ICD-10-CM

## 2020-05-30 DIAGNOSIS — G4700 Insomnia, unspecified: Secondary | ICD-10-CM

## 2020-05-30 MED ORDER — AMPHETAMINE-DEXTROAMPHETAMINE 10 MG PO TABS: 10.0000 mg | ORAL_TABLET | Freq: Every day | ORAL | Status: DC

## 2020-05-30 NOTE — Patient Instructions (Signed)
Let me see about options other than ambien and we'll be in touch.  Keep working on diet and exercise.   Update me as needed.   Take care.  Glad to see you.

## 2020-05-30 NOTE — Progress Notes (Signed)
This visit occurred during the SARS-CoV-2 public health emergency.  Safety protocols were in place, including screening questions prior to the visit, additional usage of staff PPE, and extensive cleaning of exam room while observing appropriate contact time as indicated for disinfecting solutions.  CPE- See plan.  Routine anticipatory guidance given to patient.  See health maintenance.  The possibility exists that previously documented standard health maintenance information may have been brought forward from a previous encounter into this note.  If needed, that same information has been updated to reflect the current situation based on today's encounter.    Tetanus2018 Flu encouraged PNA not due.  Shingles d/w pt covid vaccine  PSA wnl.   Colonoscopy 2017  Living will d/w pt. [name] designated if patient were incapacitated.  Diet and exercise d/w pt.  HIV neg prev per patient report, done in 2013.  HCV prev neg.    ADD with med use daily, not BID.  He was able to cut back to daily use.  Compliant.  He felt good with daily dose.  No ADE current dosing.  It does help, esp compared to time w/o med use.    Insomnia and anxiety.  Sleeping has been difficult for patient in spite of ambien.  He has more regular schedule.  He has good sleep hygiene.  Taking xanax as needed, not daily.  Some relief with med on PRN use.  Even when he stopped all stimulant use for ADD, he still had insomnia.  His insomnia is not worse with daily Adderall use.  PMH and SH reviewed  Meds, vitals, and allergies reviewed.   ROS: Per HPI.  Unless specifically indicated otherwise in HPI, the patient denies:  General: fever. Eyes: acute vision changes ENT: sore throat Cardiovascular: chest pain Respiratory: SOB GI: vomiting GU: dysuria Musculoskeletal: acute back pain Derm: acute rash Neuro: acute motor dysfunction Psych: worsening mood Endocrine: polydipsia Heme: bleeding Allergy: hayfever  GEN:  nad, alert and oriented HEENT: ncat NECK: supple w/o LA CV: rrr. PULM: ctab, no inc wob ABD: soft, +bs EXT: no edema SKIN: no acute rash

## 2020-06-02 ENCOUNTER — Telehealth: Payer: Self-pay | Admitting: Family Medicine

## 2020-06-02 MED ORDER — TRAZODONE HCL 50 MG PO TABS: 25.0000 mg | ORAL_TABLET | Freq: Every evening | ORAL | 3 refills | Status: DC | PRN

## 2020-06-02 NOTE — Assessment & Plan Note (Signed)
Tetanus2018 Flu encouraged PNA not due.  Shingles d/w pt covid vaccine  PSA wnl.   Colonoscopy 2017  Living will d/w pt. Mark Simon designated if patient were incapacitated.  Diet and exercise d/w pt.  HIV neg prev per patient report, done in 2013.  HCV prev neg.

## 2020-06-02 NOTE — Telephone Encounter (Signed)
Left message on patient's voicemail to return call

## 2020-06-02 NOTE — Assessment & Plan Note (Signed)
Improved with daily Adderall use.  He was able to cut back from twice daily use.  Continue as is.  He will update me as needed.

## 2020-06-02 NOTE — Telephone Encounter (Signed)
Please update patient.  I would try trazodone at night. Would taper ambien while increasing trazodone.  Would take 5mg  ambien with 25mg  trazodone.  Increase to 50mg  trazodone if needed.  If/when some effect, stop ambien, after about 5-7 days.   Have him let me know about effect/tolerance.  Thanks.

## 2020-06-02 NOTE — Assessment & Plan Note (Signed)
He still has trouble with insomnia in spite of Ambien use.  He has good sleep hygiene and he is doing appropriate nonmedical interventions to try to help with sleep.  I want to consider options and will be in touch with patient.

## 2020-06-02 NOTE — Assessment & Plan Note (Signed)
Occasional not daily use of Xanax.  No change in medications at this point.

## 2020-06-02 NOTE — Assessment & Plan Note (Signed)
Living will d/w pt. Mark Simon designated if patient were incapacitated. 

## 2020-06-03 NOTE — Telephone Encounter (Signed)
Patient advised.

## 2020-06-03 NOTE — Telephone Encounter (Signed)
Patient returned your call.

## 2020-06-24 ENCOUNTER — Other Ambulatory Visit: Payer: Self-pay | Admitting: Family Medicine

## 2020-06-24 NOTE — Telephone Encounter (Signed)
Patient contacted the office and states that he was taking the Trazodone as recommended, but he states he did not feel any effect from this and he has stopped taking it. Patient he did request a refill on his Ambien, and would just like to stay on this medication, rather than the Trazadone. Will route to Dr. Para March.

## 2020-06-24 NOTE — Telephone Encounter (Signed)
Electronic refill request. Zolpidem Last office visit:   05/30/2020 Last Filled:    30 tablet 1 04/16/2020  Please advise.

## 2020-06-25 NOTE — Telephone Encounter (Signed)
Sent. Thanks.   

## 2020-06-25 NOTE — Telephone Encounter (Addendum)
Noted. Med list updated.  rx sent.  Thanks.

## 2020-07-16 ENCOUNTER — Other Ambulatory Visit: Payer: Self-pay | Admitting: Family Medicine

## 2020-07-17 NOTE — Telephone Encounter (Signed)
Electronic refill request. Adderall Last office visit:   05/30/2020 Last Filled:   05/30/2020 Please advise.

## 2020-07-18 NOTE — Telephone Encounter (Signed)
Sent. Thanks.   

## 2020-07-24 DIAGNOSIS — Z20822 Contact with and (suspected) exposure to covid-19: Secondary | ICD-10-CM | POA: Diagnosis not present

## 2020-07-24 DIAGNOSIS — R05 Cough: Secondary | ICD-10-CM | POA: Diagnosis not present

## 2020-07-24 DIAGNOSIS — Z03818 Encounter for observation for suspected exposure to other biological agents ruled out: Secondary | ICD-10-CM | POA: Diagnosis not present

## 2020-08-18 ENCOUNTER — Encounter: Payer: Self-pay | Admitting: Family Medicine

## 2020-08-18 ENCOUNTER — Ambulatory Visit (INDEPENDENT_AMBULATORY_CARE_PROVIDER_SITE_OTHER)
Admission: RE | Admit: 2020-08-18 | Discharge: 2020-08-18 | Disposition: A | Discharge status: Home / Self Care | Payer: 59 | Source: Ambulatory Visit | Attending: Family Medicine | Admitting: Family Medicine

## 2020-08-18 ENCOUNTER — Ambulatory Visit: Payer: 59 | Admitting: Family Medicine

## 2020-08-18 ENCOUNTER — Other Ambulatory Visit: Payer: Self-pay

## 2020-08-18 VITALS — BP 146/90 | HR 94 | Temp 96.8°F | Ht 68.25 in | Wt 192.1 lb

## 2020-08-18 DIAGNOSIS — K219 Gastro-esophageal reflux disease without esophagitis: Secondary | ICD-10-CM | POA: Diagnosis not present

## 2020-08-18 DIAGNOSIS — M79641 Pain in right hand: Secondary | ICD-10-CM | POA: Diagnosis not present

## 2020-08-18 DIAGNOSIS — M25511 Pain in right shoulder: Secondary | ICD-10-CM

## 2020-08-18 DIAGNOSIS — M79643 Pain in unspecified hand: Secondary | ICD-10-CM

## 2020-08-18 MED ORDER — FAMOTIDINE 20 MG PO TABS: 20.0000 mg | ORAL_TABLET | Freq: Two times a day (BID) | ORAL | Status: AC

## 2020-08-18 NOTE — Patient Instructions (Addendum)
See if pepcid helps.  Elevate the head of your bed.   Tylenol as needed for pain.  Go to the lab on the way out.   If you have mychart we'll likely use that to update you.    Use the shoulder exercises.  Take care.  Glad to see you.

## 2020-08-18 NOTE — Progress Notes (Signed)
This visit occurred during the SARS-CoV-2 public health emergency.  Safety protocols were in place, including screening questions prior to the visit, additional usage of staff PPE, and extensive cleaning of exam room while observing appropriate contact time as indicated for disinfecting solutions.  GERD.  He has had some burning in the upper chest, after eating any foods but worse with spicy foods or caffeine.  TUMS help.  Going on for the last year.  Rarely vomited.  No blood in stool.  No abd pain.  He had occ nocturnal sx.  D/w pt about elevating head of bed.  No dysphagia.  No recent med use o/w.  D/w pt about pepcid trial.    Joint pain.  He had episodic R shoulder, either wrist or hand, or knee pain, midfoot pain w/o trigger. Sleep disrupted prev from pain.  No joint pain now except for R shoulder pain.  Stiffness noted in the hands. Most often bothered by R shoulder or one of the hands.  Working at [employer] at baseline.  Taking some excedrin when pain is the worst.   Meds, vitals, and allergies reviewed.   ROS: Per HPI unless specifically indicated in ROS section   nad ncat Neck supple, no LA rrr ctab Abd soft, not ttp Ext w/o edema.   Normal grip B.   Normal sensation hands.   He has normal inspection of the hands without bruising or erythema.  No ulnar deviation of the fingers. R shoulder with pain on int and ext rotation w/o AC pain.  No arm drop.

## 2020-08-19 LAB — SEDIMENTATION RATE: Sed Rate: 10 mm/hr (ref 0–20)

## 2020-08-19 LAB — RHEUMATOID FACTOR: Rheumatoid fact SerPl-aCnc: 46 IU/mL — ABNORMAL HIGH (ref <14)

## 2020-08-19 LAB — ANA W/REFLEX IF POSITIVE: Anti Nuclear Antibody (ANA): NEGATIVE

## 2020-08-19 LAB — CYCLIC CITRUL PEPTIDE ANTIBODY, IGG: Cyclic Citrullin Peptide Ab: >250 UNITS — ABNORMAL HIGH

## 2020-08-20 DIAGNOSIS — K219 Gastro-esophageal reflux disease without esophagitis: Secondary | ICD-10-CM | POA: Insufficient documentation

## 2020-08-20 DIAGNOSIS — M19049 Primary osteoarthritis, unspecified hand: Secondary | ICD-10-CM | POA: Insufficient documentation

## 2020-08-20 NOTE — Assessment & Plan Note (Signed)
Discussed elevating the head of the bed, limiting offending agents such as caffeine, and he can try Pepcid.  He will update me as needed.  Okay for outpatient follow-up.

## 2020-08-20 NOTE — Assessment & Plan Note (Signed)
See notes on imaging.  Discussed rotator cuff rehab exercises and home exercise program.  Handout given and explained.  He can start that in the meantime and then update me as needed.  Can take Tylenol as needed for pain.

## 2020-08-20 NOTE — Assessment & Plan Note (Signed)
Discussed options.  Reasonable to check routine initial rheumatologic labs and check imaging today.  See notes on imaging and lab results.  If he has significant abnormalities on either then rheumatology referral would be reasonable.

## 2020-09-02 ENCOUNTER — Other Ambulatory Visit: Payer: Self-pay | Admitting: Family Medicine

## 2020-09-02 NOTE — Telephone Encounter (Signed)
Electronic refill request. Zolpidem Last office visit:   08/18/2020 Last Filled:     30 tablet 1 06/25/2020   Electronic refill request. Alprazolam Last office visit:   08/18/2020 Last Filled:    30 tablet 1 05/23/2020

## 2020-09-03 NOTE — Telephone Encounter (Signed)
Sent. Thanks.   

## 2020-09-08 ENCOUNTER — Ambulatory Visit (INDEPENDENT_AMBULATORY_CARE_PROVIDER_SITE_OTHER): Payer: 59 | Admitting: Family Medicine

## 2020-09-08 ENCOUNTER — Encounter: Payer: Self-pay | Admitting: Family Medicine

## 2020-09-08 ENCOUNTER — Other Ambulatory Visit: Payer: Self-pay

## 2020-09-08 VITALS — BP 126/100 | HR 98 | Temp 98.2°F | Ht 68.25 in | Wt 195.3 lb

## 2020-09-08 DIAGNOSIS — M19049 Primary osteoarthritis, unspecified hand: Secondary | ICD-10-CM

## 2020-09-08 DIAGNOSIS — Z23 Encounter for immunization: Secondary | ICD-10-CM

## 2020-09-08 DIAGNOSIS — R030 Elevated blood-pressure reading, without diagnosis of hypertension: Secondary | ICD-10-CM

## 2020-09-08 LAB — CBC WITH DIFFERENTIAL/PLATELET
Basophils Absolute: 0.1 10*3/uL (ref 0.0–0.1)
Basophils Relative: 0.8 % (ref 0.0–3.0)
Eosinophils Absolute: 0.4 10*3/uL (ref 0.0–0.7)
Eosinophils Relative: 4 % (ref 0.0–5.0)
HCT: 44.4 % (ref 39.0–52.0)
Hemoglobin: 15.2 g/dL (ref 13.0–17.0)
Lymphocytes Relative: 23.1 % (ref 12.0–46.0)
Lymphs Abs: 2.1 10*3/uL (ref 0.7–4.0)
MCHC: 34.3 g/dL (ref 30.0–36.0)
MCV: 91.2 fl (ref 78.0–100.0)
Monocytes Absolute: 0.5 10*3/uL (ref 0.1–1.0)
Monocytes Relative: 5.8 % (ref 3.0–12.0)
Neutro Abs: 5.9 10*3/uL (ref 1.4–7.7)
Neutrophils Relative %: 66.3 % (ref 43.0–77.0)
Platelets: 224 10*3/uL (ref 150.0–400.0)
RBC: 4.87 Mil/uL (ref 4.22–5.81)
RDW: 12.5 % (ref 11.5–15.5)
WBC: 8.9 10*3/uL (ref 4.0–10.5)

## 2020-09-08 LAB — URIC ACID: Uric Acid, Serum: 3.6 mg/dL — ABNORMAL LOW (ref 4.0–7.8)

## 2020-09-08 MED ORDER — PREDNISONE 20 MG PO TABS: ORAL_TABLET | ORAL | 0 refills | Status: DC

## 2020-09-08 NOTE — Patient Instructions (Addendum)
Flu shot today.  You have acute arthritis of the hand joint - suspect rheumatoid arthritis flare vs gout flare - check labs today to evaluate uric acid for gout.  Start prednisone course sent to pharmacy.  Keep rheumatology appointment.  Keep hand elevated.   Rheumatoid Arthritis Rheumatoid arthritis (RA) is a long-term (chronic) disease that causes inflammation in your joints. RA may start slowly. It most often affects the small joints of the hands and feet. Usually, the same joints are affected on both sides of your body. Inflammation from RA can also affect other parts of your body, including your heart, eyes, or lungs. There is no cure for RA, but medicines can help your symptoms and halt or slow down the progression of the disease. What are the causes? RA is an autoimmune disease. When you have an autoimmune disease, your body's defense system (immune system) mistakenly attacks healthy body tissues. The exact cause of RA is not known. What increases the risk? You are more likely to develop this condition if you:  Are a woman.  Have a family history of RA or other autoimmune diseases.  Have a history of smoking.  Are obese.  Have been exposed to pollutants or chemicals. What are the signs or symptoms? The first symptom of this condition may be morning stiffness that lasts longer than 30 minutes.  Symptoms usually start gradually. They are often worse in the morning. As RA progresses, symptoms may include:  Pain, stiffness, swelling, warmth, and tenderness in joints on both sides of your body.  Loss of energy.  Loss of appetite.  Weight loss.  Low-grade fever.  Dry eyes and dry mouth.  Firm lumps (rheumatoid nodules) that grow beneath your skin in areas such as your forearm bones near your elbows and on your hands.  Changes in the appearance of joints (deformity) and loss of joint function. Symptoms of this condition vary from person to person.  Symptoms of RA often  come and go.  Sometimes, symptoms get worse for a period of time. These are called flares. How is this diagnosed? This condition is diagnosed based on your symptoms, medical history, and physical exam.  You may have X-rays or an MRI to check for the type of joint changes that are caused by RA. You may also have blood tests to look for:  Proteins (antibodies) that your immune system may make if you have RA. These include rheumatoid factor (RF) and anti-CCP. ? When blood tests show these proteins, you are said to have "seropositive RA." ? When blood tests do not show these proteins, you may have "seronegative RA."  Inflammation in your blood.  A low number of red blood cells (anemia). How is this treated? The goals of treatment are to relieve pain, reduce inflammation, and slow down or stop joint damage and disability. Treatment may include:  Lifestyle changes. It is important to rest as needed, eat a healthy diet, and exercise.  Medicines. Your health care provider may adjust your medicines every 3 months until treatment goals are reached. Common medicines include: ? Pain relievers (analgesics). ? Corticosteroids and NSAIDs to reduce inflammation. ? Disease-modifying antirheumatic drugs (DMARDs) to try to slow the course of the disease. ? Biologic response modifiers to reduce inflammation and damage.  Physical therapy and occupational therapy.  Surgery, if you have severe joint damage. Joint replacement or fusing of joints may be needed. Your health care provider will work with you to identify the best treatment option for you based  on assessment of the overall disease activity in your body. Follow these instructions at home: Activity  Return to your normal activities as told by your health care provider. Ask your health care provider what activities are safe for you.  Rest when you are having a flare.  Start an exercise program as told by your health care provider. General  instructions  Keep all follow-up visits as told by your health care provider. This is important.  Take over-the-counter and prescription medicines only as told by your health care provider. Where to find more information  [employer]: www.rheumatology.org  Arthritis Foundation: www.arthritis.org Contact a health care provider if:  You have a flare-up of RA symptoms.  You have a fever.  You have side effects from your medicines. Get help right away if:  You have chest pain.  You have trouble breathing.  You quickly develop a hot, painful joint that is more severe than your usual joint aches. Summary  Rheumatoid arthritis (RA) is a long-term (chronic) disease that causes inflammation in your joints.  RA is an autoimmune disease.  The goals of treatment are to relieve pain, reduce inflammation, and slow down or stop joint damage and disability. This information is not intended to replace advice given to you by your health care provider. Make sure you discuss any questions you have with your health care provider. Document Revised: 06/05/2019 Document Reviewed: 07/25/2018 Elsevier Patient Education  2020 [employer].

## 2020-09-08 NOTE — Assessment & Plan Note (Signed)
BP elevated today attributed to acute arthritis flare causing significant pain.  Advised to start monitoring BP at home and let us know if consistently >140/90. He can use husband's bp cuff.

## 2020-09-08 NOTE — Progress Notes (Signed)
This visit was conducted in person.  BP (!) 126/100 (BP Location: Right Arm, Patient Position: Sitting, Cuff Size: Normal)   Pulse 98   Temp 98.2 F (36.8 C) (Temporal)   Ht 5' 8.25" (1.734 m)   Wt 195 lb 5 oz (88.6 kg)   SpO2 97%   BMI 29.48 kg/m   BP Readings from Last 3 Encounters:  09/08/20 (!) 126/100  08/18/20 (!) 146/90  05/30/20 110/68   CC: L hand injury Subjective:    Patient ID: Mark Simon, male    DOB: 1966/06/17, 54 y.o.   MRN: [id number]  HPI: Mark Simon is a 54 y.o. male presenting on 09/08/2020 for Edema (C/o left hand swelling.  Denies injury.  Started about 1 wk ago. Has also had same issue in right hand.  Area is very painful.  Has issue off and on.  Has rhematology appt 09/17/20.)   1 wk h/o L hand swelling at 2nd MCP joint. Had similar swelling at R 5th MCP joint - that has since improved. When this happens, joint gets red, warm, can be itchy and exquisitely painful to light touch - even sheet going over hand. Denies inciting trauma/injury or falls. No h/o gout.  No results found for: LABURIC   Ongoing joint trouble predominantly B shoulders, R hip, B dorsal feet, and B hands for the past 2 yrs. Manages with OTC tylenol or NSAID without much benefit.   Upcoming rheum appt 09/17/2020 - recent work up by PCP reviewed - ANA neg, ERS 10, anti CCP >250, RF 46.      Relevant past medical, surgical, family and social history reviewed and updated as indicated. Interim medical history since our last visit reviewed. Allergies and medications reviewed and updated. Outpatient Medications Prior to Visit  Medication Sig Dispense Refill  . ALPRAZolam (XANAX) 0.25 MG tablet TAKE 1/2 TABLET BY MOUTH TWICE DAILY AS NEEDED 30 tablet 1  . amphetamine-dextroamphetamine (ADDERALL) 10 MG tablet TAKE 1 TABLET BY MOUTH 2 TIMES DAILY. 60 tablet 0  . chlorpheniramine (CHLOR-TRIMETON) 4 MG tablet Take 4 mg by mouth daily as needed for allergies.    . famotidine (PEPCID) 20 MG  tablet Take 1 tablet (20 mg total) by mouth 2 (two) times daily.    . Multiple Vitamin (MULTIVITAMIN) capsule Take 1 capsule by mouth daily.    Marland Kitchen zolpidem (AMBIEN) 10 MG tablet TAKE 1 TABLET BY MOUTH EVERYDAY AT BEDTIME 30 tablet 1   No facility-administered medications prior to visit.     Per HPI unless specifically indicated in ROS section below Review of Systems Objective:  BP (!) 126/100 (BP Location: Right Arm, Patient Position: Sitting, Cuff Size: Normal)   Pulse 98   Temp 98.2 F (36.8 C) (Temporal)   Ht 5' 8.25" (1.734 m)   Wt 195 lb 5 oz (88.6 kg)   SpO2 97%   BMI 29.48 kg/m   Wt Readings from Last 3 Encounters:  09/08/20 195 lb 5 oz (88.6 kg)  08/18/20 192 lb 2 oz (87.1 kg)  05/30/20 191 lb 4 oz (86.8 kg)      Physical Exam Vitals and nursing note reviewed.  Constitutional:      Appearance: Normal appearance. He is not ill-appearing.  Musculoskeletal:        General: Swelling and tenderness present.     Comments:  R hand WNL L hand 2nd MCP with active synovitis - swollen, warm, red and tender to palpation   Skin:  General: Skin is warm and dry.     Findings: Erythema present. No rash.  Neurological:     Mental Status: He is alert.  Psychiatric:        Mood and Affect: Mood normal.        Behavior: Behavior normal.       Results for orders placed or performed in visit on 08/18/20  Rheumatoid factor  Result Value Ref Range   Rhuematoid fact SerPl-aCnc 46 (H) <14 IU/mL  Cyclic Citrul Peptide Antibody, IGG  Result Value Ref Range   Cyclic Citrullin Peptide Ab >161 (H) UNITS  Sedimentation rate  Result Value Ref Range   Sed Rate 10 0 - 20 mm/hr  ANA w/Reflex if Positive  Result Value Ref Range   Anti Nuclear Antibody (ANA) Negative Negative   Assessment & Plan:  This visit occurred during the SARS-CoV-2 public health emergency.  Safety protocols were in place, including screening questions prior to the visit, additional usage of staff PPE, and  extensive cleaning of exam room while observing appropriate contact time as indicated for disinfecting solutions.   Problem List Items Addressed This Visit    Hand arthritis - Primary    Exam with active synovitis today, ?RA vs gout flare - check uric acid today and treat with prednisone taper with routine steroid precautions. Discussed pathophysiology of gout and dietary correlation. Recent labs more suggestive of rheumatoid arthritis - glad he has upcoming rheumatologic evaluation planned. Will leave DMARD recs to them.       Relevant Medications   predniSONE (DELTASONE) 20 MG tablet   Other Relevant Orders   CBC with Differential/Platelet   Uric acid   Elevated blood pressure reading in office without diagnosis of hypertension    BP elevated today attributed to acute arthritis flare causing significant pain.  Advised to start monitoring BP at home and let us know if consistently >140/90. He can use husband's bp cuff.        Other Visit Diagnoses    Need for influenza vaccination       Relevant Orders   Flu Vaccine QUAD 36+ mos IM (Completed)       Meds ordered this encounter  Medications  . predniSONE (DELTASONE) 20 MG tablet    Sig: Take two tablets daily for 4 days followed by one tablet daily for 4 days    Dispense:  12 tablet    Refill:  0   Orders Placed This Encounter  Procedures  . Flu Vaccine QUAD 36+ mos IM  . CBC with Differential/Platelet  . Uric acid    Patient instructions: Flu shot today  You have acute arthritis of the hand joint - suspect rheumatoid arthritis flare vs gout flare - check labs today to evaluate uric acid for gout.  Start prednisone course sent to pharmacy.  Keep rheumatology appointment.  Keep hand elevated.   Follow up plan: Return if symptoms worsen or fail to improve.  Eustaquio Boyden, MD

## 2020-09-08 NOTE — Assessment & Plan Note (Addendum)
Exam with active synovitis today, ?RA vs gout flare - check uric acid today and treat with prednisone taper with routine steroid precautions. Discussed pathophysiology of gout and dietary correlation. Recent labs more suggestive of rheumatoid arthritis - glad he has upcoming rheumatologic evaluation planned. Will leave DMARD recs to them.

## 2020-09-13 ENCOUNTER — Other Ambulatory Visit: Payer: Self-pay | Admitting: Family Medicine

## 2020-09-15 NOTE — Telephone Encounter (Signed)
Last office visit 09/08/2020 with Dr. Reece Agar for hand arthritis.  Last refilled 07/18/2020 for #60 with no refills. UDS/Contract 2018. No future appointments.

## 2020-09-16 ENCOUNTER — Other Ambulatory Visit: Payer: Self-pay | Admitting: Family Medicine

## 2020-09-16 MED ORDER — AMPHETAMINE-DEXTROAMPHETAMINE 10 MG PO TABS
10.0000 mg | ORAL_TABLET | Freq: Two times a day (BID) | ORAL | 0 refills | Status: DC
Start: 2020-09-16 — End: 2020-11-05

## 2020-09-16 NOTE — Telephone Encounter (Signed)
Sent. Thanks.   

## 2020-09-17 ENCOUNTER — Other Ambulatory Visit: Payer: Self-pay | Admitting: Rheumatology

## 2020-09-17 DIAGNOSIS — M0579 Rheumatoid arthritis with rheumatoid factor of multiple sites without organ or systems involvement: Secondary | ICD-10-CM | POA: Diagnosis not present

## 2020-09-17 DIAGNOSIS — Z111 Encounter for screening for respiratory tuberculosis: Secondary | ICD-10-CM | POA: Diagnosis not present

## 2020-09-17 DIAGNOSIS — Z79899 Other long term (current) drug therapy: Secondary | ICD-10-CM | POA: Diagnosis not present

## 2020-10-29 ENCOUNTER — Other Ambulatory Visit: Payer: Self-pay | Admitting: Rheumatology

## 2020-10-29 DIAGNOSIS — M0579 Rheumatoid arthritis with rheumatoid factor of multiple sites without organ or systems involvement: Secondary | ICD-10-CM | POA: Diagnosis not present

## 2020-10-29 DIAGNOSIS — Z79899 Other long term (current) drug therapy: Secondary | ICD-10-CM | POA: Diagnosis not present

## 2020-11-03 ENCOUNTER — Other Ambulatory Visit: Payer: Self-pay | Admitting: Family Medicine

## 2020-11-04 ENCOUNTER — Other Ambulatory Visit: Payer: Self-pay | Admitting: Family Medicine

## 2020-11-04 NOTE — Telephone Encounter (Signed)
Please Advise

## 2020-11-04 NOTE — Telephone Encounter (Signed)
Name of Medication: Amphetamine Salts 10 mg Name of Pharmacy: [facility]  Last Fill or Written Date and Quantity: 09/16/2020 (Q-60, R-0) Last Office Visit and Type: 08/18/2020 Next Office Visit and Type: Not Scheduled Last Controlled Substance Agreement Date: 05/09/2017 Last UDS: 05/09/2017

## 2020-11-05 ENCOUNTER — Other Ambulatory Visit: Payer: Self-pay | Admitting: Family Medicine

## 2020-11-05 NOTE — Telephone Encounter (Signed)
Sent. Thanks.   

## 2020-11-17 ENCOUNTER — Other Ambulatory Visit: Payer: Self-pay | Admitting: Rheumatology

## 2021-01-05 ENCOUNTER — Other Ambulatory Visit: Payer: Self-pay | Admitting: Family Medicine

## 2021-01-05 NOTE — Telephone Encounter (Signed)
Refill request for Alprazolam 0.25mg  tab and Zolpidem 10 mg tabs.   LOV - 09/08/20 Next OV - not scheduled Last refilled both on 11/05/20 #30/1 on both rxs

## 2021-01-06 NOTE — Telephone Encounter (Signed)
Sent. Thanks.   

## 2021-01-29 ENCOUNTER — Other Ambulatory Visit: Payer: Self-pay | Admitting: Rheumatology

## 2021-01-29 DIAGNOSIS — M0579 Rheumatoid arthritis with rheumatoid factor of multiple sites without organ or systems involvement: Secondary | ICD-10-CM | POA: Diagnosis not present

## 2021-01-29 DIAGNOSIS — Z79899 Other long term (current) drug therapy: Secondary | ICD-10-CM | POA: Diagnosis not present

## 2021-01-30 ENCOUNTER — Other Ambulatory Visit: Payer: Self-pay | Admitting: Family Medicine

## 2021-01-30 IMAGING — DX DG SHOULDER 2+V*R*
3 series · 3 of 3 positions shown · non-contrast
Comparison: None.

CLINICAL DATA: Right shoulder pain

EXAM:
RIGHT SHOULDER - 2+ VIEW

[shoulder axial]
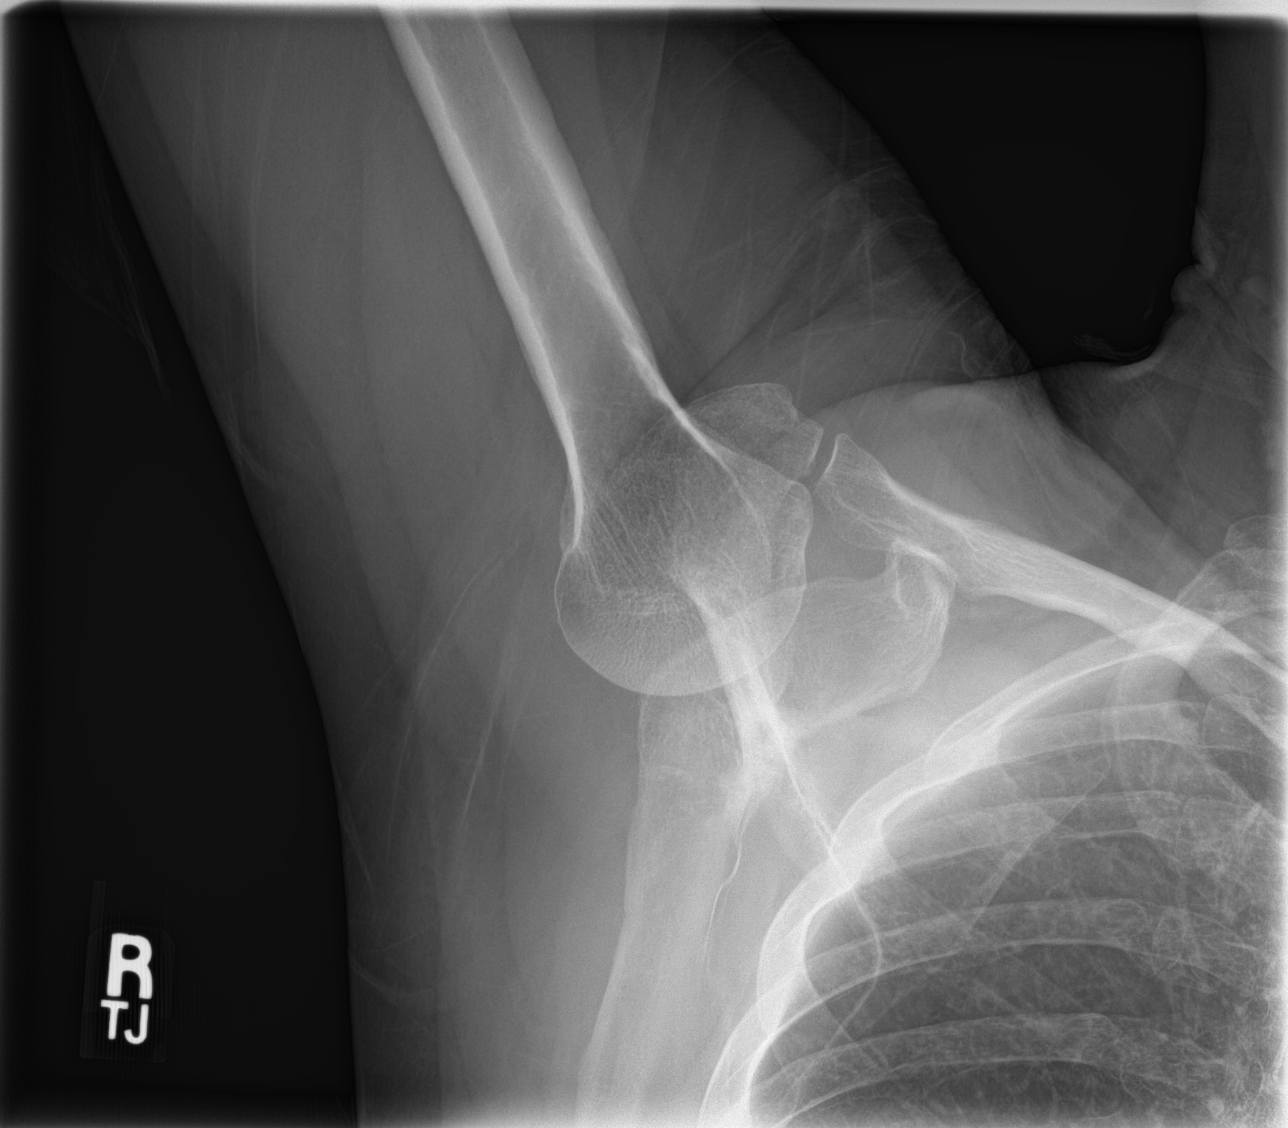

[shoulder obl]
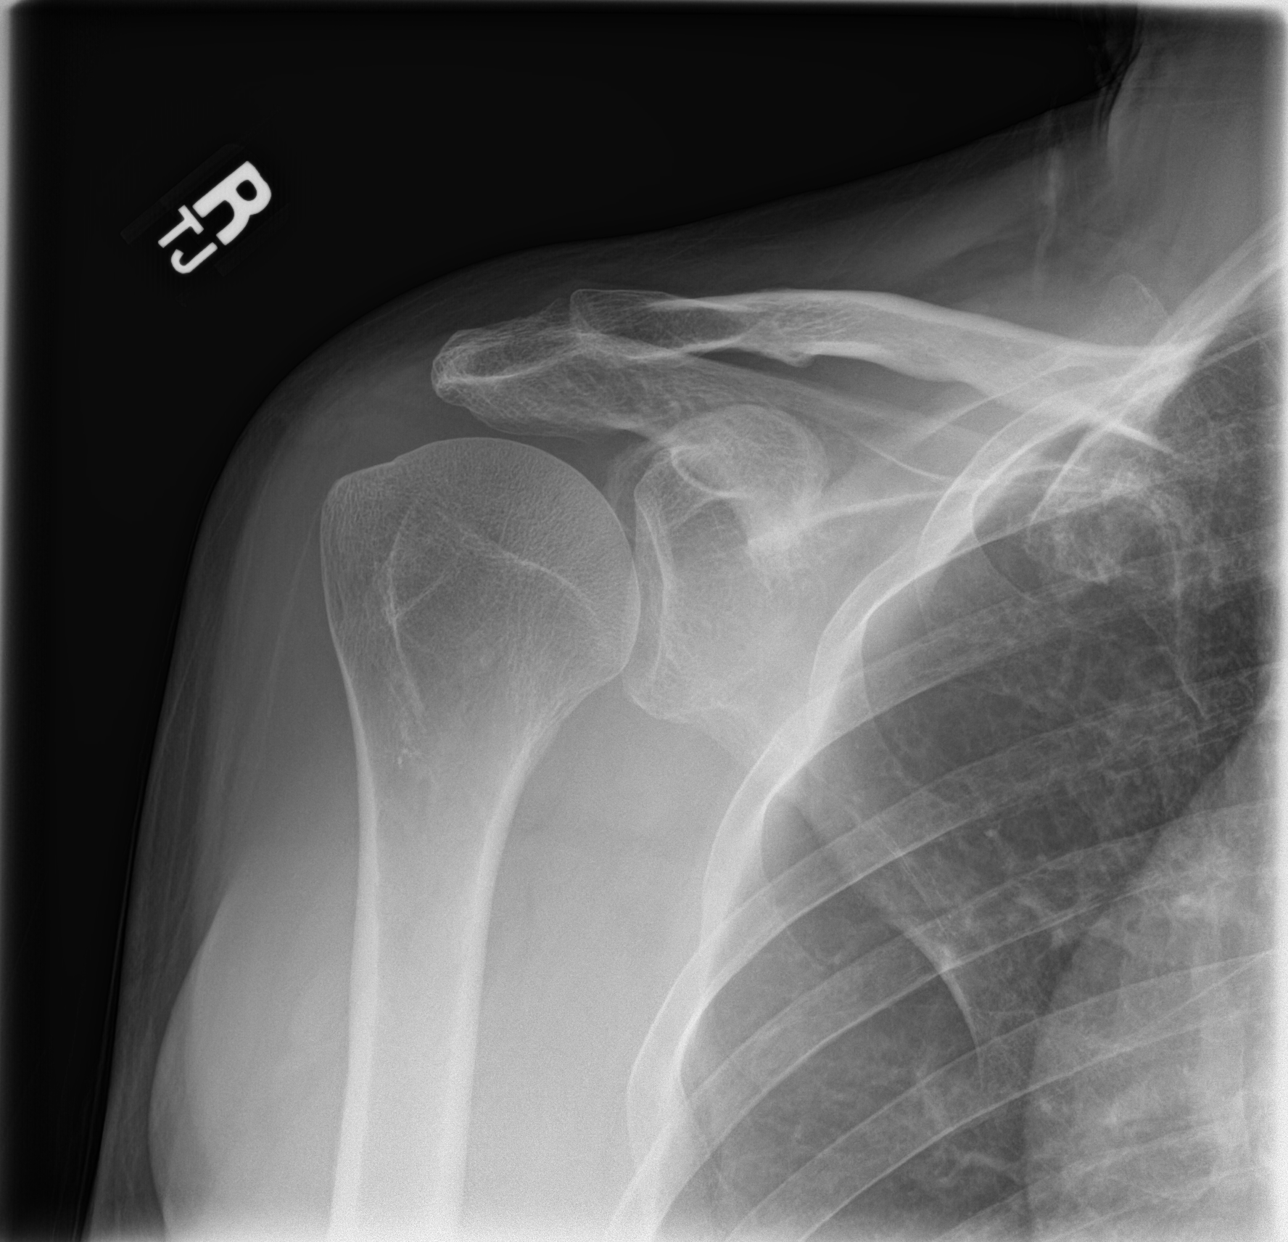

[shoulder y-view]
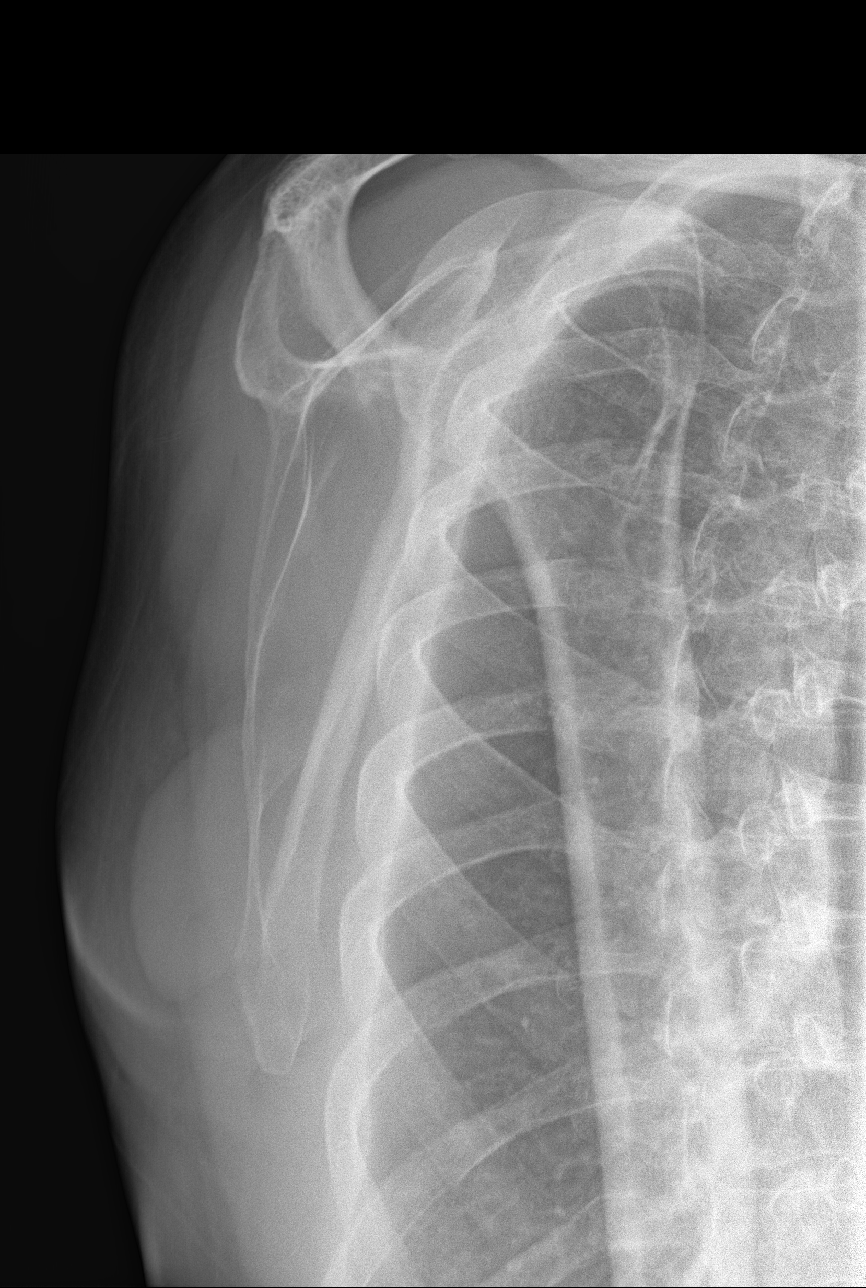

[3 of 3 positions shown; findings below may reference images not displayed]

FINDINGS: Glenohumeral joint is normal. Normal humeral acromial distance. AC
joint is normal. Regional ribs appear normal.
IMPRESSION: Negative.

## 2021-01-30 IMAGING — DX DG HAND COMPLETE 3+V*R*
3 series · 3 of 3 positions shown · non-contrast
Comparison: None.

CLINICAL DATA: Hand pain

EXAM:
RIGHT HAND - COMPLETE 3+ VIEW

[hand ap]
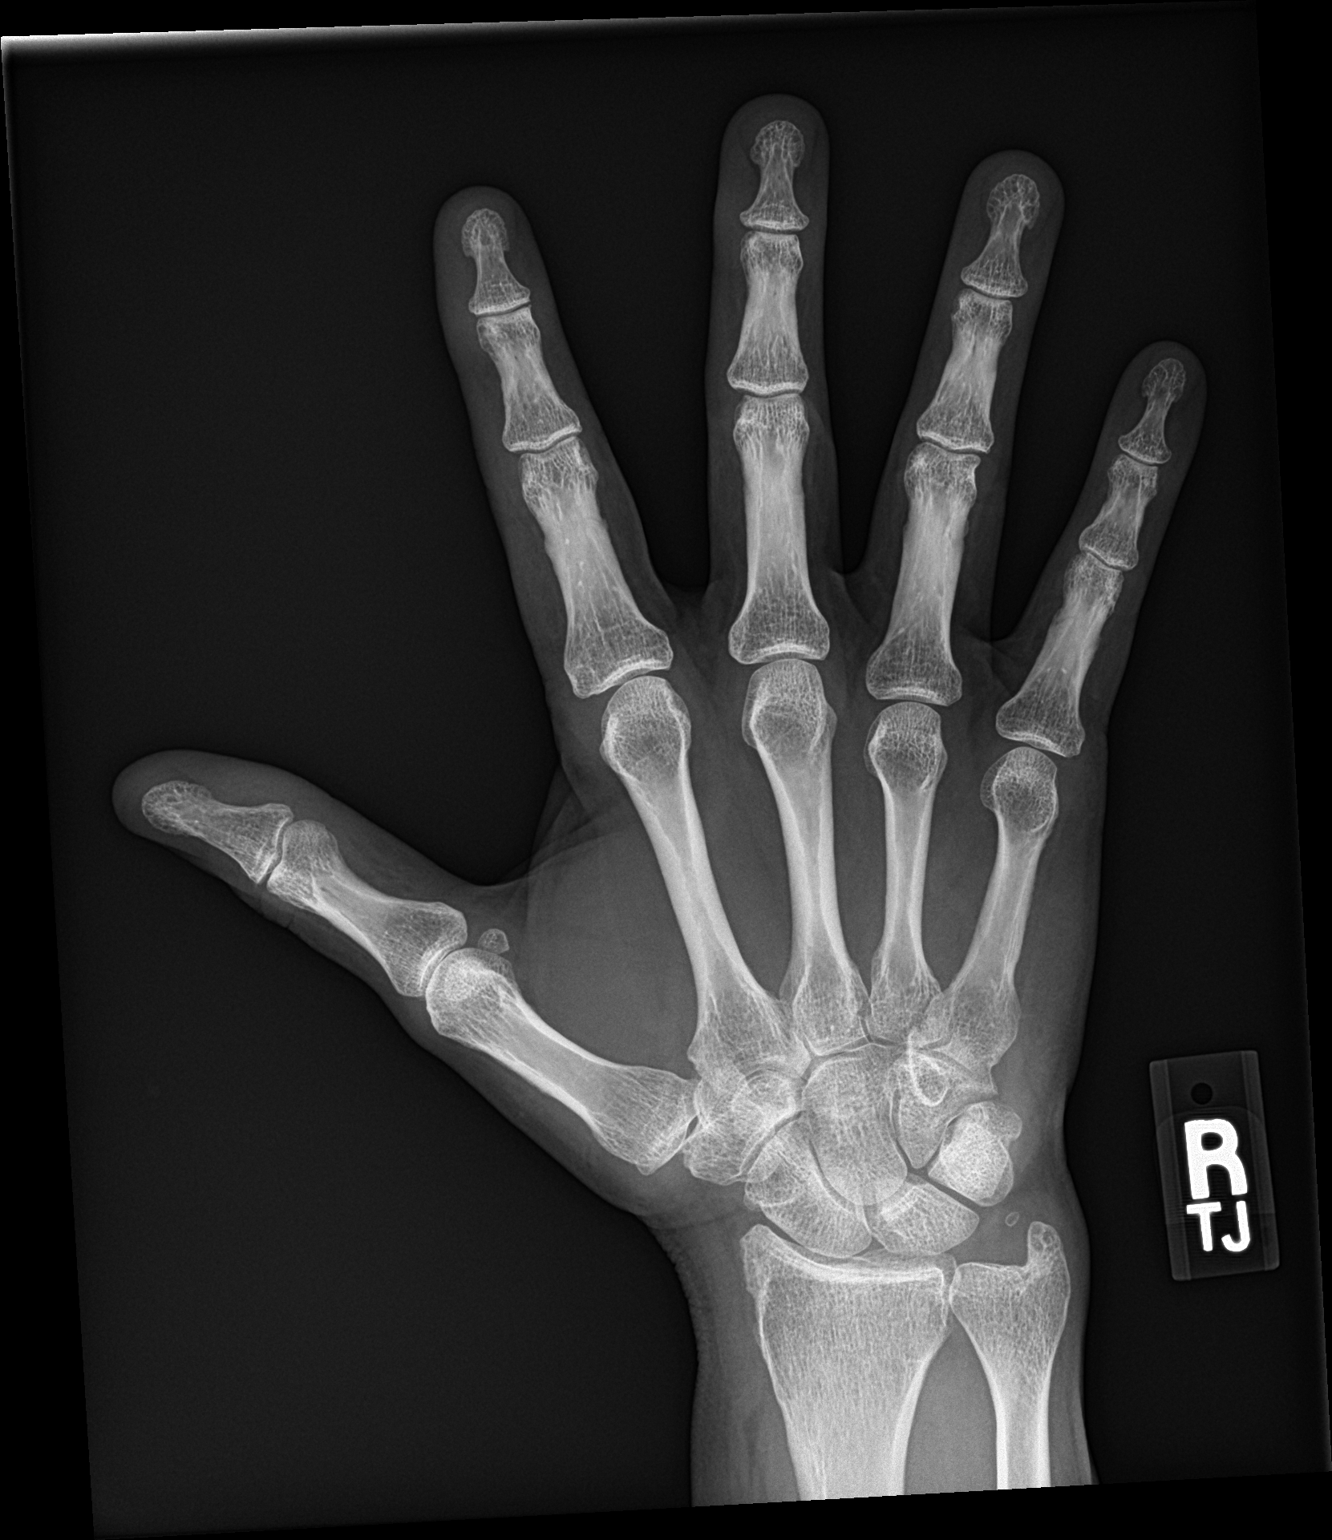

[hand obl]
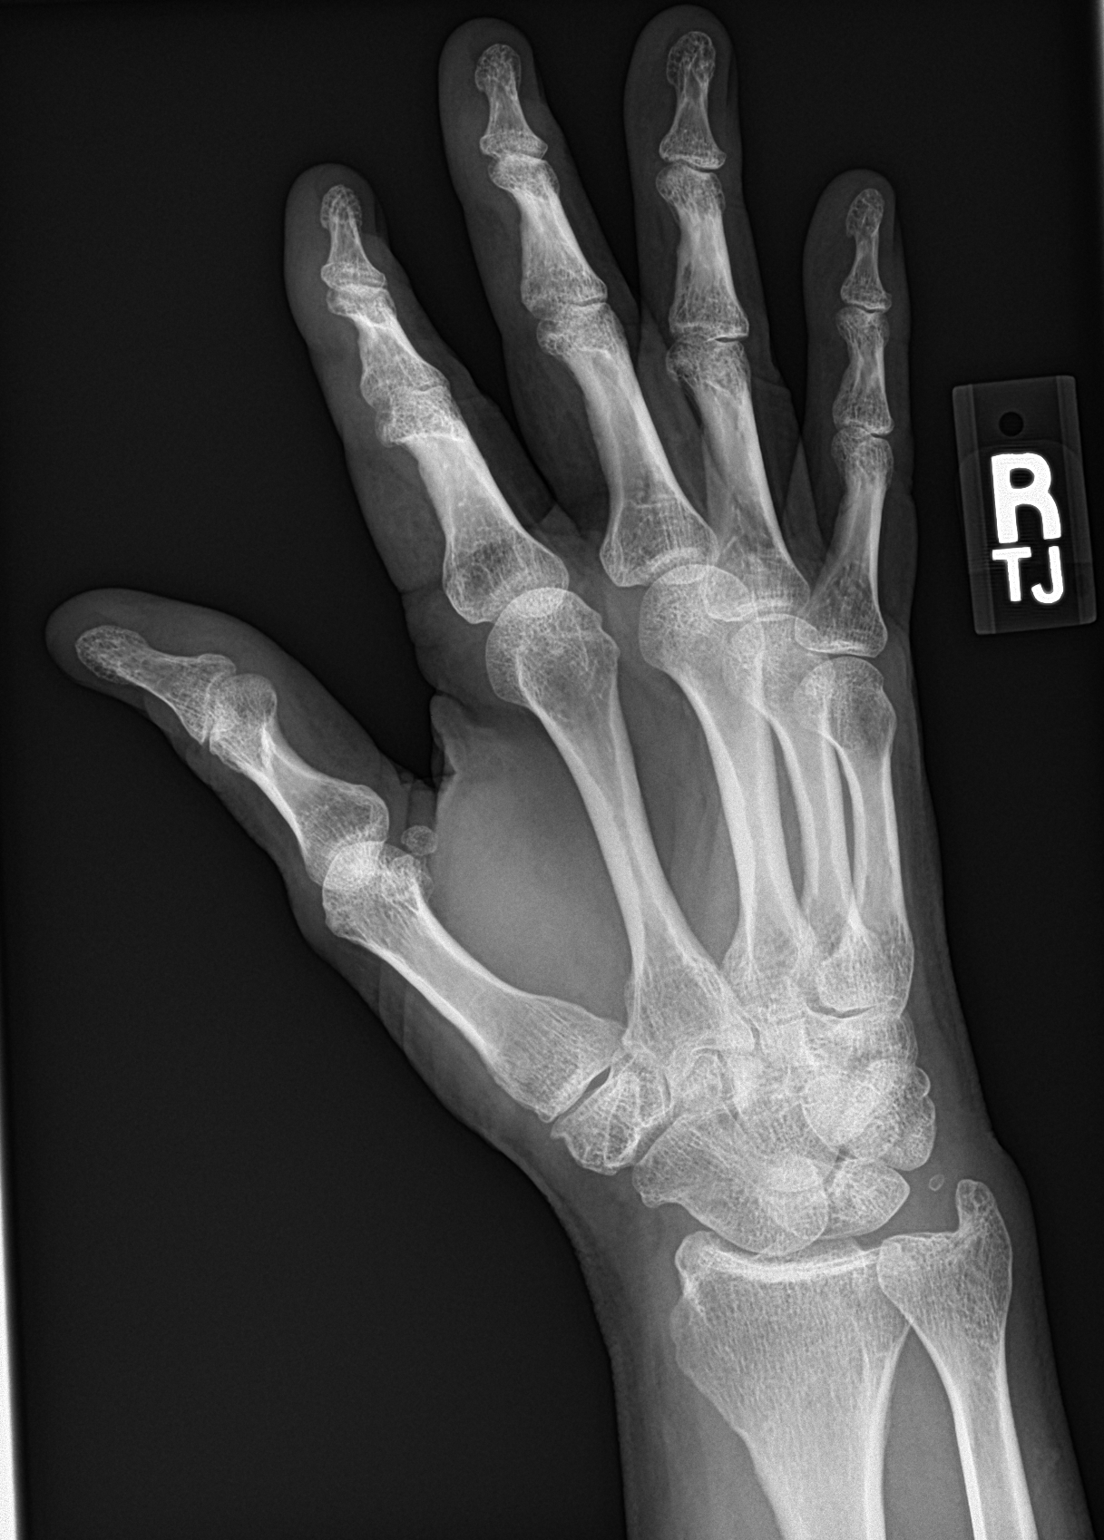

[hand lat]
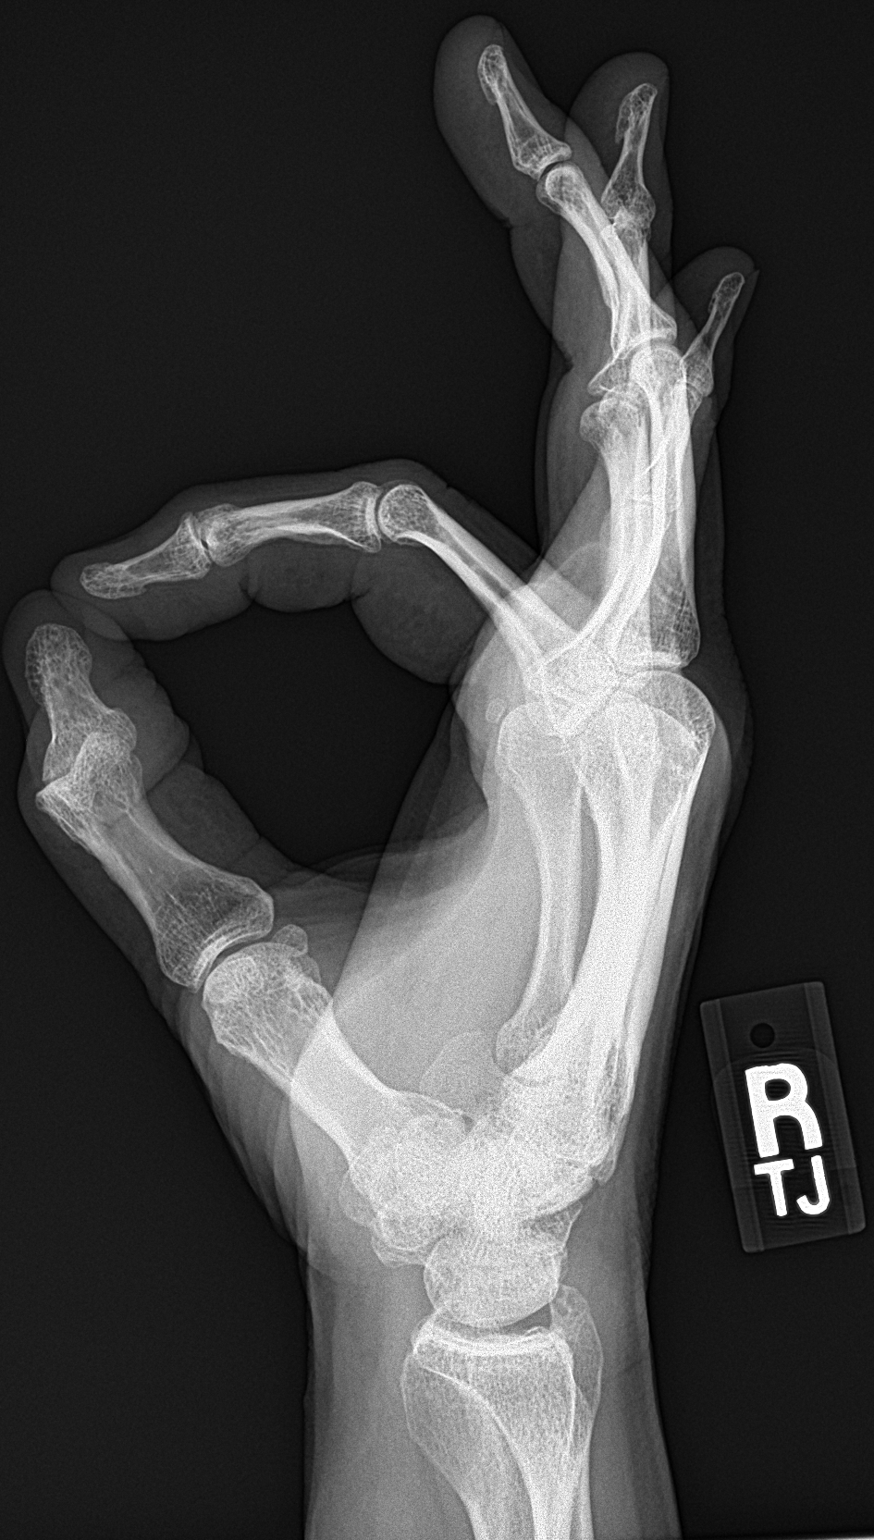

[3 of 3 positions shown; findings below may reference images not displayed]

FINDINGS: No evidence of fracture or dislocation. No evidence of MCP joint or
inter phalangeal joint arthritis. Carpal region structures appear
normal. Wrist joint appears normal as seen.
IMPRESSION: Normal radiographs.

## 2021-01-30 NOTE — Telephone Encounter (Signed)
Refill request for Adderall 10 mg tablets  LOV - 09/08/20 Next OV -  Not scheduled Last refilled - 11/05/20 #60/0

## 2021-02-01 ENCOUNTER — Other Ambulatory Visit: Payer: Self-pay | Admitting: Family Medicine

## 2021-02-01 NOTE — Telephone Encounter (Signed)
Sent. Thanks.   

## 2021-03-04 ENCOUNTER — Other Ambulatory Visit: Payer: Self-pay | Admitting: Family Medicine

## 2021-03-04 NOTE — Telephone Encounter (Signed)
Refill request for Xanax 0.25 mg tablets and Ambien 10 mg tablets.  LOV - 09/08/20 Next OV - not scheduled Last refill - 01/06/21 #30/1 for both rxs

## 2021-03-04 NOTE — Telephone Encounter (Signed)
Sent. Thanks.   

## 2021-04-02 ENCOUNTER — Other Ambulatory Visit: Payer: Self-pay | Admitting: Family Medicine

## 2021-04-02 MED FILL — Methotrexate Sodium Tab 2.5 MG (Base Equiv): ORAL | 28 days supply | Qty: 32 | Fill #0 | Status: AC

## 2021-04-03 ENCOUNTER — Other Ambulatory Visit: Payer: Self-pay

## 2021-04-03 NOTE — Telephone Encounter (Signed)
Refill request for Adderall 10 mg tablets  LOV - 09/08/20 Next OV - 04/07/21 Last refill - 02/01/21 #60/0

## 2021-04-05 MED ORDER — AMPHETAMINE-DEXTROAMPHETAMINE 10 MG PO TABS
ORAL_TABLET | Freq: Two times a day (BID) | ORAL | 0 refills | Status: DC
  Filled 2021-04-05: qty 60, 30d supply, fill #0

## 2021-04-05 NOTE — Telephone Encounter (Signed)
Sent. Thanks.   

## 2021-04-06 ENCOUNTER — Other Ambulatory Visit: Payer: Self-pay

## 2021-04-07 ENCOUNTER — Ambulatory Visit (INDEPENDENT_AMBULATORY_CARE_PROVIDER_SITE_OTHER)
Admission: RE | Admit: 2021-04-07 | Discharge: 2021-04-07 | Disposition: A | Discharge status: Home / Self Care | Payer: 59 | Source: Ambulatory Visit | Attending: Family Medicine | Admitting: Family Medicine

## 2021-04-07 ENCOUNTER — Encounter: Payer: Self-pay | Admitting: Family Medicine

## 2021-04-07 ENCOUNTER — Other Ambulatory Visit: Payer: Self-pay

## 2021-04-07 ENCOUNTER — Ambulatory Visit: Payer: 59 | Admitting: Family Medicine

## 2021-04-07 VITALS — BP 106/80 | HR 90 | Temp 97.2°F | Ht 68.0 in | Wt 199.0 lb

## 2021-04-07 DIAGNOSIS — R0602 Shortness of breath: Secondary | ICD-10-CM

## 2021-04-07 LAB — BRAIN NATRIURETIC PEPTIDE: Pro B Natriuretic peptide (BNP): 21 pg/mL (ref 0.0–100.0)

## 2021-04-07 LAB — TSH: TSH: 1.32 u[IU]/mL (ref 0.35–4.50)

## 2021-04-07 LAB — COMPREHENSIVE METABOLIC PANEL
ALT: 27 U/L (ref 0–53)
AST: 17 U/L (ref 0–37)
Albumin: 4.7 g/dL (ref 3.5–5.2)
Alkaline Phosphatase: 69 U/L (ref 39–117)
BUN: 10 mg/dL (ref 6–23)
CO2: 28 mEq/L (ref 19–32)
Calcium: 9.9 mg/dL (ref 8.4–10.5)
Chloride: 103 mEq/L (ref 96–112)
Creatinine, Ser: 0.94 mg/dL (ref 0.40–1.50)
GFR: 90.67 mL/min (ref >60.00)
Glucose, Bld: 98 mg/dL (ref 70–99)
Potassium: 4.3 mEq/L (ref 3.5–5.1)
Sodium: 139 mEq/L (ref 135–145)
Total Bilirubin: 0.5 mg/dL (ref 0.2–1.2)
Total Protein: 7.2 g/dL (ref 6.0–8.3)

## 2021-04-07 LAB — CBC WITH DIFFERENTIAL/PLATELET
Basophils Absolute: 0.1 10*3/uL (ref 0.0–0.1)
Basophils Relative: 2.2 % (ref 0.0–3.0)
Eosinophils Absolute: 0.4 10*3/uL (ref 0.0–0.7)
Eosinophils Relative: 6.3 % — ABNORMAL HIGH (ref 0.0–5.0)
HCT: 42.4 % (ref 39.0–52.0)
Hemoglobin: 14.8 g/dL (ref 13.0–17.0)
Lymphocytes Relative: 34.5 % (ref 12.0–46.0)
Lymphs Abs: 2.1 10*3/uL (ref 0.7–4.0)
MCHC: 34.9 g/dL (ref 30.0–36.0)
MCV: 94 fl (ref 78.0–100.0)
Monocytes Absolute: 0.4 10*3/uL (ref 0.1–1.0)
Monocytes Relative: 6.7 % (ref 3.0–12.0)
Neutro Abs: 3.1 10*3/uL (ref 1.4–7.7)
Neutrophils Relative %: 50.3 % (ref 43.0–77.0)
Platelets: 253 10*3/uL (ref 150.0–400.0)
RBC: 4.51 Mil/uL (ref 4.22–5.81)
RDW: 12.7 % (ref 11.5–15.5)
WBC: 6.2 10*3/uL (ref 4.0–10.5)

## 2021-04-07 NOTE — Progress Notes (Signed)
This visit occurred during the SARS-CoV-2 public health emergency.  Safety protocols were in place, including screening questions prior to the visit, additional usage of staff PPE, and extensive cleaning of exam room while observing appropriate contact time as indicated for disinfecting solutions.  He is on MTX per rheum.  Still having joint pain.  He had clear response initially but then sx got worse in the meantime, with more joint pain.  His pain is not as severe as prior to starting methotrexate, but he does not have as much benefit currently as he did with initial treatment.  This predates the issues below.  03/12/21.  Went home from work, not feeling well.  Home test positive for covid.  Temp >100 at that point.  "It hit me hard."  Didn't go to ER but had dec in appetite, had cough/nausea.  Exhaustion.  Felt SOB with walking, taking a shower.  Concentration worse since covid.  More insomnia in the meantime.  No fevers.  Rarely lightheaded.  No syncope.  Taste and smell are still affected per patient report.  Still with some cough, with sputum improving.    Prior to covid, he had some preexisting some fatigue but more in the meantime.  He was vaccinated for covid prev.   He tried taking tylenol for joint aches but it doesn't help a lot.    Meds, vitals, and allergies reviewed.   ROS: Per HPI unless specifically indicated in ROS section   GEN: nad, alert and oriented HEENT: ncat NECK: supple w/o LA CV: rrr PULM: ctab, no inc wob ABD: soft, +bs EXT: no edema SKIN: no acute rash  Pulse ox at rest mid 90s, ~95%,  Pulse ~90 at rest.  Similar ox reading with walking, pulse up to 101.    30 minutes were devoted to patient care in this encounter (this includes time spent reviewing the patient's file/history, interviewing and examining the patient, counseling/reviewing plan with patient).

## 2021-04-07 NOTE — Patient Instructions (Signed)
Go to the lab on the way out.   If you have mychart we'll likely use that to update you.    Let me see about options in the meantime.  Take care.  Glad to see you.

## 2021-04-08 DIAGNOSIS — R0602 Shortness of breath: Secondary | ICD-10-CM | POA: Insufficient documentation

## 2021-04-08 NOTE — Assessment & Plan Note (Signed)
In the setting of previously diagnosed rheumatoid arthritis, with worsening symptoms since developing COVID.  He has been more short of breath with walking.  His heart rate goes up to 101 with slow walking in the clinic.  He is not hypoxic but he can feel short of breath and exhausted just by taking a shower.  His concentration has been affected in the meantime and is having more insomnia.  His lungs are clear and at this point he still okay for outpatient follow-up.  We need to exclude his many contributory issues as possible.  See notes on x-ray and labs.  We will update rheumatology.  I presume that his symptoms that have gotten worse in the meantime or post-COVID/postinfectious in nature.  Discussed.

## 2021-04-13 ENCOUNTER — Other Ambulatory Visit: Payer: Self-pay | Admitting: Family Medicine

## 2021-04-13 DIAGNOSIS — R0602 Shortness of breath: Secondary | ICD-10-CM

## 2021-04-24 DIAGNOSIS — H5213 Myopia, bilateral: Secondary | ICD-10-CM | POA: Diagnosis not present

## 2021-04-29 ENCOUNTER — Other Ambulatory Visit: Payer: Self-pay

## 2021-04-29 DIAGNOSIS — M0579 Rheumatoid arthritis with rheumatoid factor of multiple sites without organ or systems involvement: Secondary | ICD-10-CM | POA: Diagnosis not present

## 2021-04-29 DIAGNOSIS — Z79899 Other long term (current) drug therapy: Secondary | ICD-10-CM | POA: Diagnosis not present

## 2021-04-29 MED ORDER — METHOTREXATE 2.5 MG PO TABS
ORAL_TABLET | ORAL | 2 refills | Status: DC
  Filled 2021-04-29 (×2): qty 32, 28d supply, fill #0
  Filled 2021-05-27: qty 32, 28d supply, fill #1
  Filled 2021-06-27: qty 32, 28d supply, fill #2

## 2021-04-30 ENCOUNTER — Other Ambulatory Visit: Payer: Self-pay

## 2021-04-30 ENCOUNTER — Other Ambulatory Visit: Payer: Self-pay | Admitting: Family Medicine

## 2021-05-01 NOTE — Telephone Encounter (Signed)
Refill request for Alprazolam 0.25 tablets and Ambien 10 mg tablets  LOV - 04/07/21 Next OV - not scheduled Last refill - 02/22/21 #30/1 for both rxs

## 2021-05-04 NOTE — Telephone Encounter (Signed)
Sent. Thanks.   

## 2021-05-07 ENCOUNTER — Other Ambulatory Visit: Payer: Self-pay

## 2021-05-14 NOTE — Telephone Encounter (Signed)
FYI to Dr Para March. Appointment cancelled.

## 2021-05-27 ENCOUNTER — Other Ambulatory Visit: Payer: Self-pay

## 2021-05-28 ENCOUNTER — Other Ambulatory Visit: Payer: 59

## 2021-06-11 ENCOUNTER — Other Ambulatory Visit: Payer: 59

## 2021-06-15 ENCOUNTER — Encounter: Payer: 59 | Admitting: Family Medicine

## 2021-06-15 ENCOUNTER — Other Ambulatory Visit: Payer: Self-pay | Admitting: Family Medicine

## 2021-06-15 ENCOUNTER — Other Ambulatory Visit: Payer: Self-pay

## 2021-06-16 ENCOUNTER — Other Ambulatory Visit: Payer: Self-pay

## 2021-06-17 ENCOUNTER — Other Ambulatory Visit: Payer: Self-pay

## 2021-06-17 MED FILL — Amphetamine-Dextroamphetamine Tab 10 MG: ORAL | 30 days supply | Qty: 60 | Fill #0 | Status: AC

## 2021-06-17 NOTE — Telephone Encounter (Signed)
Sent. Thanks.   

## 2021-06-17 NOTE — Telephone Encounter (Signed)
Pt requesting Adderall 10mg  tab LOV: 04/07/2021 Next Visit: 07/03/2021 Last refill: 04/05/2021  Approve?

## 2021-06-26 ENCOUNTER — Other Ambulatory Visit: Payer: 59

## 2021-06-26 ENCOUNTER — Encounter: Payer: 59 | Admitting: Family Medicine

## 2021-06-29 ENCOUNTER — Other Ambulatory Visit: Payer: Self-pay

## 2021-06-30 ENCOUNTER — Other Ambulatory Visit: Payer: Self-pay

## 2021-07-01 ENCOUNTER — Other Ambulatory Visit: Payer: Self-pay | Admitting: Family Medicine

## 2021-07-02 NOTE — Telephone Encounter (Signed)
Refill request for Alprazolam 0.25 mg tablets and ambien 10 mg tablets  LOV - 04/07/21 Next OV - 07/03/21 Last refill - 05/04/21 #30/1 on both rxs

## 2021-07-03 ENCOUNTER — Other Ambulatory Visit: Payer: Self-pay

## 2021-07-03 ENCOUNTER — Ambulatory Visit (INDEPENDENT_AMBULATORY_CARE_PROVIDER_SITE_OTHER): Payer: 59 | Admitting: Family Medicine

## 2021-07-03 ENCOUNTER — Encounter: Payer: Self-pay | Admitting: Family Medicine

## 2021-07-03 VITALS — BP 120/84 | HR 92 | Temp 97.4°F | Ht 68.0 in | Wt 200.0 lb

## 2021-07-03 DIAGNOSIS — R41840 Attention and concentration deficit: Secondary | ICD-10-CM

## 2021-07-03 DIAGNOSIS — Z7189 Other specified counseling: Secondary | ICD-10-CM

## 2021-07-03 DIAGNOSIS — Z125 Encounter for screening for malignant neoplasm of prostate: Secondary | ICD-10-CM | POA: Diagnosis not present

## 2021-07-03 DIAGNOSIS — Z131 Encounter for screening for diabetes mellitus: Secondary | ICD-10-CM

## 2021-07-03 DIAGNOSIS — Z0000 Encounter for general adult medical examination without abnormal findings: Secondary | ICD-10-CM | POA: Diagnosis not present

## 2021-07-03 DIAGNOSIS — M069 Rheumatoid arthritis, unspecified: Secondary | ICD-10-CM

## 2021-07-03 DIAGNOSIS — G4700 Insomnia, unspecified: Secondary | ICD-10-CM

## 2021-07-03 DIAGNOSIS — L989 Disorder of the skin and subcutaneous tissue, unspecified: Secondary | ICD-10-CM

## 2021-07-03 DIAGNOSIS — F419 Anxiety disorder, unspecified: Secondary | ICD-10-CM

## 2021-07-03 LAB — GLUCOSE, RANDOM: Glucose, Bld: 98 mg/dL (ref 70–99)

## 2021-07-03 LAB — PSA: PSA: 0.68 ng/mL (ref 0.10–4.00)

## 2021-07-03 NOTE — Telephone Encounter (Signed)
Sent. Thanks.   

## 2021-07-03 NOTE — Patient Instructions (Addendum)
Go to the lab on the way out.   If you have mychart we'll likely use that to update you.    Take care.  Glad to see you.  Likely a benign hemangioma on the neck.  Update me as needed.

## 2021-07-03 NOTE — Progress Notes (Signed)
This visit occurred during the SARS-CoV-2 public health emergency.  Safety protocols were in place, including screening questions prior to the visit, additional usage of staff PPE, and extensive cleaning of exam room while observing appropriate contact time as indicated for disinfecting solutions.  CPE- See plan.  Routine anticipatory guidance given to patient.  See health maintenance.  The possibility exists that previously documented standard health maintenance information may have been brought forward from a previous encounter into this note.  If needed, that same information has been updated to reflect the current situation based on today's encounter.    Tetanus 2018 Flu encouraged PNA not due. Shingles d/w pt covid vaccine PSA pending 2022.  FH noted.   Colonoscopy 2017 Living will d/w pt. [name] designated if patient were incapacitated. Diet and exercise d/w pt.   HIV neg prev per patient report, done in 2013. HCV prev neg.     Labs pending.  See notes on labs.  On MTX per outside clinic.  He is feeling better in the meantime.  D/w pt.  Much less aches.  Still with some fatigue.    Mood and concentration d/w pt.  Mod is good.  Concentration and fatigue are better with stimulant.  No ADE on meds.    Skin lesion on L side of neck.  Triangular lesion, occ itches, present for months.   PMH and SH reviewed  Meds, vitals, and allergies reviewed.   ROS: Per HPI.  Unless specifically indicated otherwise in HPI, the patient denies:  General: fever. Eyes: acute vision changes ENT: sore throat Cardiovascular: chest pain Respiratory: SOB GI: vomiting GU: dysuria Musculoskeletal: acute back pain Derm: acute rash Neuro: acute motor dysfunction Psych: worsening mood Endocrine: polydipsia Heme: bleeding Allergy: hayfever  GEN: nad, alert and oriented HEENT: ncat NECK: supple w/o LA CV: rrr. PULM: ctab, no inc wob ABD: soft, +bs EXT: no edema SKIN: no acute rash but  likely 1cm benign appearing superficial hemangioma on the L side of the neck w/o LA. no mass.  No ulceration.

## 2021-07-05 DIAGNOSIS — M069 Rheumatoid arthritis, unspecified: Secondary | ICD-10-CM | POA: Insufficient documentation

## 2021-07-05 DIAGNOSIS — L989 Disorder of the skin and subcutaneous tissue, unspecified: Secondary | ICD-10-CM | POA: Insufficient documentation

## 2021-07-05 NOTE — Assessment & Plan Note (Signed)
Continue Adderall 

## 2021-07-05 NOTE — Assessment & Plan Note (Signed)
Tetanus 2018 Flu encouraged PNA not due. Shingles d/w pt covid vaccine PSA pending 2022.  FH noted.   Colonoscopy 2017 Living will d/w pt. Mark Simon designated if patient were incapacitated. Diet and exercise d/w pt.   HIV neg prev per patient report, done in 2013. HCV prev neg.

## 2021-07-05 NOTE — Assessment & Plan Note (Addendum)
Reasonable to continue as needed Ambien it has helped historically.

## 2021-07-05 NOTE — Assessment & Plan Note (Signed)
Living will d/w pt. Mark Simon designated if patient were incapacitated. 

## 2021-07-05 NOTE — Assessment & Plan Note (Addendum)
Mood is good.  Continue as needed Xanax.

## 2021-07-05 NOTE — Assessment & Plan Note (Signed)
likely 1cm benign appearing superficial hemangioma on the L side of the neck w/o LA. no mass.  No ulceration.  He can update me as needed.

## 2021-07-05 NOTE — Assessment & Plan Note (Signed)
I will defer to rheumatology On methotrexate and feeling better in the meantime.

## 2021-07-27 ENCOUNTER — Other Ambulatory Visit: Payer: Self-pay | Admitting: Family Medicine

## 2021-07-27 ENCOUNTER — Other Ambulatory Visit: Payer: Self-pay

## 2021-07-27 MED ORDER — METHOTREXATE 2.5 MG PO TABS
ORAL_TABLET | ORAL | 2 refills | Status: DC
  Filled 2021-07-27: qty 32, 28d supply, fill #0
  Filled 2021-08-24: qty 32, 28d supply, fill #1

## 2021-07-27 NOTE — Telephone Encounter (Signed)
Refill request for amphetamine-dextroamphetamine (ADDERALL) 10 MG tablet  LOV - 07/03/21 Next OV - not scheduled Last refill - 06/17/21 #60/0

## 2021-07-28 ENCOUNTER — Other Ambulatory Visit: Payer: Self-pay

## 2021-07-28 MED ORDER — AMPHETAMINE-DEXTROAMPHETAMINE 10 MG PO TABS
10.0000 mg | ORAL_TABLET | Freq: Two times a day (BID) | ORAL | 0 refills | Status: DC
  Filled 2021-07-28: qty 60, 30d supply, fill #0

## 2021-07-28 MED ORDER — AMPHETAMINE-DEXTROAMPHETAMINE 10 MG PO TABS
ORAL_TABLET | Freq: Two times a day (BID) | ORAL | 0 refills | Status: DC
  Filled 2021-07-28: qty 60, fill #0
  Filled 2021-08-31: qty 60, 30d supply, fill #0

## 2021-07-28 MED FILL — Amphetamine-Dextroamphetamine Tab 10 MG: ORAL | 30 days supply | Qty: 60 | Fill #0 | Status: AC

## 2021-07-28 NOTE — Telephone Encounter (Signed)
Sent. Thanks.   

## 2021-08-05 DIAGNOSIS — Z79899 Other long term (current) drug therapy: Secondary | ICD-10-CM | POA: Diagnosis not present

## 2021-08-05 DIAGNOSIS — M0579 Rheumatoid arthritis with rheumatoid factor of multiple sites without organ or systems involvement: Secondary | ICD-10-CM | POA: Diagnosis not present

## 2021-08-24 ENCOUNTER — Other Ambulatory Visit: Payer: Self-pay

## 2021-08-26 ENCOUNTER — Other Ambulatory Visit: Payer: Self-pay

## 2021-08-26 ENCOUNTER — Other Ambulatory Visit: Payer: Self-pay | Admitting: Family Medicine

## 2021-08-26 NOTE — Telephone Encounter (Signed)
Rx cancelled.

## 2021-08-26 NOTE — Telephone Encounter (Signed)
We are unable to delete this request. Only providers for controlled substances. He has a rx that was written last month.

## 2021-08-27 ENCOUNTER — Other Ambulatory Visit: Payer: Self-pay

## 2021-08-28 ENCOUNTER — Other Ambulatory Visit: Payer: Self-pay | Admitting: Family Medicine

## 2021-08-28 NOTE — Telephone Encounter (Signed)
Refill request for Alprazolam 0.25 mg tablets and ambien 10 mg tablet  LOV - 07/03/21 Next OV - not scheduled Last refill - 07/03/21 #30/1 for both

## 2021-08-30 NOTE — Telephone Encounter (Signed)
Sent. Thanks.   

## 2021-08-31 ENCOUNTER — Other Ambulatory Visit: Payer: Self-pay

## 2021-09-21 ENCOUNTER — Other Ambulatory Visit: Payer: Self-pay

## 2021-09-21 MED ORDER — METHOTREXATE 2.5 MG PO TABS
ORAL_TABLET | ORAL | 2 refills | Status: DC
  Filled 2021-09-21: qty 32, 28d supply, fill #0
  Filled 2021-11-20: qty 32, 28d supply, fill #1

## 2021-10-16 ENCOUNTER — Other Ambulatory Visit: Payer: Self-pay

## 2021-10-16 MED ORDER — METHOTREXATE 2.5 MG PO TABS
ORAL_TABLET | ORAL | 2 refills | Status: AC
  Filled 2021-10-16: qty 32, 28d supply, fill #0

## 2021-11-04 ENCOUNTER — Other Ambulatory Visit: Payer: Self-pay | Admitting: Family Medicine

## 2021-11-06 NOTE — Telephone Encounter (Signed)
Sent. Thanks.   

## 2021-11-06 NOTE — Telephone Encounter (Signed)
Refill request for Ambien 10 mg tablets and Xanax 0.25 mg tablets  LOV - 07/03/21 Next OV - not scheduled Last refill - 08/30/21 #30/1 on both rxs

## 2021-11-17 ENCOUNTER — Other Ambulatory Visit: Payer: Self-pay | Admitting: Family Medicine

## 2021-11-17 ENCOUNTER — Other Ambulatory Visit: Payer: Self-pay

## 2021-11-17 MED ORDER — AMPHETAMINE-DEXTROAMPHETAMINE 10 MG PO TABS
ORAL_TABLET | Freq: Two times a day (BID) | ORAL | 0 refills | Status: DC
  Filled 2021-11-17: qty 60, 30d supply, fill #0

## 2021-11-17 MED ORDER — AMPHETAMINE-DEXTROAMPHETAMINE 10 MG PO TABS
10.0000 mg | ORAL_TABLET | Freq: Two times a day (BID) | ORAL | 0 refills | Status: DC
  Filled 2021-11-17: qty 60, 30d supply, fill #0

## 2021-11-17 MED ORDER — AMPHETAMINE-DEXTROAMPHETAMINE 10 MG PO TABS
ORAL_TABLET | Freq: Two times a day (BID) | ORAL | 0 refills | Status: DC
  Filled 2021-11-17: qty 60, 30d supply, fill #0
  Filled 2021-11-17: qty 60, fill #0
  Filled 2022-01-18: qty 60, 30d supply, fill #0

## 2021-11-17 NOTE — Telephone Encounter (Signed)
Sent. Thanks.   

## 2021-11-17 NOTE — Telephone Encounter (Signed)
Refill request for  amphetamine-dextroamphetamine (ADDERALL) 10 MG tablet  LOV - 07/03/21 Next OV - not scheduled Last refill - 07/28/21 #60/0 x3

## 2021-11-18 MED ORDER — FOLIC ACID 1 MG PO TABS: 1.0000 mg | ORAL_TABLET | Freq: Every day | ORAL | 3 refills | Status: DC

## 2021-11-18 NOTE — Telephone Encounter (Signed)
Would continue.  Sent.  Thanks.  

## 2021-11-18 NOTE — Telephone Encounter (Signed)
Last OV 06/13/21. Doesn't look like we have filled this before.

## 2021-11-20 ENCOUNTER — Other Ambulatory Visit: Payer: Self-pay

## 2022-01-07 ENCOUNTER — Other Ambulatory Visit: Payer: Self-pay | Admitting: Family Medicine

## 2022-01-08 NOTE — Telephone Encounter (Signed)
Refill request for Zolpidem 10 mg tablets and xanax 0.25 mg tablets  LOV - 07/03/21 Next OV - not scheduled Last refill - 11/06/21 #30/1 for both rxs

## 2022-01-10 NOTE — Telephone Encounter (Signed)
Sent. Thanks.   

## 2022-01-18 ENCOUNTER — Other Ambulatory Visit: Payer: Self-pay

## 2022-01-20 ENCOUNTER — Other Ambulatory Visit: Payer: Self-pay

## 2022-03-09 ENCOUNTER — Other Ambulatory Visit: Payer: Self-pay | Admitting: Family Medicine

## 2022-03-09 NOTE — Telephone Encounter (Signed)
Refill request for Alprazolam 0.25 mg tablets and Ambien 10 mg tablets ? ?LOV - 07/03/21 ?Next OV - not scheduled ?Last refill - 01/10/22 #30/1 for both rxs ? ?

## 2022-04-25 ENCOUNTER — Other Ambulatory Visit: Payer: Self-pay | Admitting: Family Medicine

## 2022-04-26 ENCOUNTER — Other Ambulatory Visit: Payer: Self-pay

## 2022-04-26 NOTE — Telephone Encounter (Signed)
Refill request for amphetamine-dextroamphetamine (ADDERALL) 10 MG tablet  LOV - 07/03/21 Next OV - 07/23/22 Last refill - 11/17/21 #60/0 x3

## 2022-04-27 ENCOUNTER — Other Ambulatory Visit: Payer: Self-pay

## 2022-04-27 MED ORDER — AMPHETAMINE-DEXTROAMPHETAMINE 10 MG PO TABS
ORAL_TABLET | ORAL | 0 refills | Status: DC
  Filled 2022-04-27: qty 60, fill #0

## 2022-04-27 MED ORDER — AMPHETAMINE-DEXTROAMPHETAMINE 10 MG PO TABS
ORAL_TABLET | Freq: Two times a day (BID) | ORAL | 0 refills | Status: DC
  Filled 2022-04-27: qty 60, 30d supply, fill #0

## 2022-04-27 MED ORDER — AMPHETAMINE-DEXTROAMPHETAMINE 10 MG PO TABS
10.0000 mg | ORAL_TABLET | Freq: Two times a day (BID) | ORAL | 0 refills | Status: DC
  Filled 2022-04-27: qty 60, 30d supply, fill #0

## 2022-04-27 NOTE — Telephone Encounter (Signed)
Sent. Thanks.   

## 2022-04-28 ENCOUNTER — Other Ambulatory Visit: Payer: Self-pay

## 2022-05-07 ENCOUNTER — Other Ambulatory Visit: Payer: Self-pay | Admitting: Family Medicine

## 2022-05-07 NOTE — Telephone Encounter (Signed)
Refill request for ZOLPIDEM TARTRATE 10 MG TABLET  LOV - 07/03/21 Next OV - 07/23/22 Last refill - 03/10/22 #30/1

## 2022-06-03 ENCOUNTER — Other Ambulatory Visit: Payer: Self-pay | Admitting: Family Medicine

## 2022-06-04 NOTE — Telephone Encounter (Signed)
Refill request for ALPRAZOLAM 0.25 MG TABLET  LOV - 07/03/21 Next OV - 07/23/22 Last refill - 03/10/22 #30/1

## 2022-06-22 ENCOUNTER — Ambulatory Visit (INDEPENDENT_AMBULATORY_CARE_PROVIDER_SITE_OTHER): Payer: 59 | Admitting: Family Medicine

## 2022-06-22 ENCOUNTER — Encounter: Payer: Self-pay | Admitting: Family Medicine

## 2022-06-22 DIAGNOSIS — K529 Noninfective gastroenteritis and colitis, unspecified: Secondary | ICD-10-CM

## 2022-06-22 MED ORDER — HUMIRA (2 SYRINGE) 40 MG/0.4ML ~~LOC~~ PSKT
40.0000 mg | PREFILLED_SYRINGE | SUBCUTANEOUS | Status: DC
Start: 2022-06-22 — End: 2023-07-05

## 2022-06-22 NOTE — Progress Notes (Signed)
Neg covid test.  Fatigue occ noted initially, some days better than others, for weeks.  Then in the last 3 days with diarrhea, occ headaches that are brief.  Stayed in bed 2 days ago.  Dec in appetite with BM right after eating.  Vomited once.  Taking fluids.  Still making urine.  Rarely lightheaded, with position change.  No fevers.  Has been out of work.  No bloody or black stools.  Cramping prior BM, cramping better after BM.  Sig other with possibly similar sx in the meantime.    We talked about deferring his fatigue work-up at this point as his labs could be affected by his recent illness and give a false positive.  He agreed.  It is unclear if his fatigue is related to his rheumatologic diagnosis.  Discussed.  Meds, vitals, and allergies reviewed.   ROS: Per HPI unless specifically indicated in ROS section   GEN: nad, alert and oriented HEENT: ncat NECK: supple w/o LA CV: rrr.  PULM: ctab, no inc wob ABD: soft, +bs, nontender to palpation. EXT: no edema SKIN: no acute rash, well perfused.

## 2022-06-22 NOTE — Patient Instructions (Signed)
Out of work in the meantime.  You can try imodium but the main issue is drinking fluids.   Gradually advance your diet.   Update me as needed.   Take care.  Glad to see you.

## 2022-06-23 DIAGNOSIS — K529 Noninfective gastroenteritis and colitis, unspecified: Secondary | ICD-10-CM | POA: Insufficient documentation

## 2022-06-23 NOTE — Assessment & Plan Note (Signed)
Likely infectious gastroenteritis as it has been present in the community recently.  Benign abdominal exam and okay for outpatient follow-up.  Routine cautions given to patient. Out of work in the meantime.  He can try imodium but the main issue is drinking fluids.   Gradually advance diet.   Update me as needed.   He agrees with plan.

## 2022-07-07 ENCOUNTER — Other Ambulatory Visit: Payer: Self-pay | Admitting: Family Medicine

## 2022-07-08 NOTE — Telephone Encounter (Signed)
Refill request for ZOLPIDEM TARTRATE 10 MG TABLET  LOV - 06/22/22 Next OV - 07/23/22 Last refill - 05/09/22 #30/1

## 2022-07-23 ENCOUNTER — Ambulatory Visit (INDEPENDENT_AMBULATORY_CARE_PROVIDER_SITE_OTHER): Payer: 59 | Admitting: Family Medicine

## 2022-07-23 ENCOUNTER — Encounter: Payer: Self-pay | Admitting: Family Medicine

## 2022-07-23 ENCOUNTER — Other Ambulatory Visit: Payer: Self-pay

## 2022-07-23 VITALS — BP 118/80 | HR 78 | Temp 97.5°F | Ht 68.0 in | Wt 195.0 lb

## 2022-07-23 DIAGNOSIS — R41840 Attention and concentration deficit: Secondary | ICD-10-CM

## 2022-07-23 DIAGNOSIS — Z0000 Encounter for general adult medical examination without abnormal findings: Secondary | ICD-10-CM

## 2022-07-23 DIAGNOSIS — G4700 Insomnia, unspecified: Secondary | ICD-10-CM

## 2022-07-23 DIAGNOSIS — Z8249 Family history of ischemic heart disease and other diseases of the circulatory system: Secondary | ICD-10-CM | POA: Diagnosis not present

## 2022-07-23 DIAGNOSIS — Z125 Encounter for screening for malignant neoplasm of prostate: Secondary | ICD-10-CM

## 2022-07-23 DIAGNOSIS — M069 Rheumatoid arthritis, unspecified: Secondary | ICD-10-CM

## 2022-07-23 DIAGNOSIS — Z7189 Other specified counseling: Secondary | ICD-10-CM

## 2022-07-23 LAB — LIPID PANEL
Cholesterol: 201 mg/dL — ABNORMAL HIGH (ref 0–200)
HDL: 52.5 mg/dL (ref >39.00)
LDL Cholesterol: 116 mg/dL — ABNORMAL HIGH (ref 0–99)
NonHDL: 148.05
Total CHOL/HDL Ratio: 4
Triglycerides: 159 mg/dL — ABNORMAL HIGH (ref 0.0–149.0)
VLDL: 31.8 mg/dL (ref 0.0–40.0)

## 2022-07-23 LAB — PSA: PSA: 0.92 ng/mL (ref 0.10–4.00)

## 2022-07-23 LAB — GLUCOSE, RANDOM: Glucose, Bld: 94 mg/dL (ref 70–99)

## 2022-07-23 MED ORDER — AMPHETAMINE-DEXTROAMPHETAMINE 10 MG PO TABS
ORAL_TABLET | Freq: Two times a day (BID) | ORAL | 0 refills | Status: DC
  Filled 2022-07-23: qty 60, 30d supply, fill #0

## 2022-07-23 MED ORDER — AMPHETAMINE-DEXTROAMPHETAMINE 10 MG PO TABS
10.0000 mg | ORAL_TABLET | Freq: Two times a day (BID) | ORAL | 0 refills | Status: DC
  Filled 2022-07-23 – 2022-12-21 (×2): qty 60, 30d supply, fill #0

## 2022-07-23 MED ORDER — AMPHETAMINE-DEXTROAMPHETAMINE 10 MG PO TABS
ORAL_TABLET | ORAL | 0 refills | Status: DC
  Filled 2022-07-23: qty 60, fill #0
  Filled 2022-10-14: qty 60, 30d supply, fill #0

## 2022-07-23 NOTE — Patient Instructions (Signed)
I would get a flu shot each fall.  Go to the lab on the way out.   If you have mychart we'll likely use that to update you.    Take care.  Glad to see you.

## 2022-07-23 NOTE — Progress Notes (Unsigned)
CPE- See plan.  Routine anticipatory guidance given to patient.  See health maintenance.  The possibility exists that previously documented standard health maintenance information may have been brought forward from a previous encounter into this note.  If needed, that same information has been updated to reflect the current situation based on today's encounter.    Tetanus 2018 Flu encouraged PNA not due. Shingles d/w pt covid vaccine PSA pending 2023.  FH noted.   Colonoscopy 2017 Living will d/w pt. [name] designated if patient were incapacitated. Diet and exercise d/w pt.   HIV neg prev per patient report, done in 2013. HCV prev neg.     RA per rheum and recent labs d/w pt.    Insomnia improved with ambien, but less effect with bigger meal.  D/w pt about dosing/meals.  No ADE on med.    Mood and concentration d/w pt.  No ADE on med.  Adderall helped.  FH CAD with lipids pending Dennie Bible grandfather).  See notes on labs.  PMH and SH reviewed  Meds, vitals, and allergies reviewed.   ROS: Per HPI.  Unless specifically indicated otherwise in HPI, the patient denies:  General: fever. Eyes: acute vision changes ENT: sore throat Cardiovascular: chest pain Respiratory: SOB GI: vomiting GU: dysuria Musculoskeletal: acute back pain Derm: acute rash Neuro: acute motor dysfunction Psych: worsening mood Endocrine: polydipsia Heme: bleeding Allergy: hayfever  GEN: nad, alert and oriented HEENT: mucous membranes moist NECK: supple w/o LA CV: rrr. PULM: ctab, no inc wob ABD: soft, +bs EXT: no edema SKIN: no acute rash

## 2022-07-25 NOTE — Assessment & Plan Note (Signed)
Continue Adderall as is. 

## 2022-07-25 NOTE — Assessment & Plan Note (Signed)
Per outside clinic. 

## 2022-07-25 NOTE — Assessment & Plan Note (Addendum)
Tetanus 2018 Flu encouraged PNA not due. Shingles d/w pt covid vaccine PSA pending 2023.  FH noted.   Colonoscopy 2017 Living will d/w pt. Mark Simon designated if patient were incapacitated. Diet and exercise d/w pt.   HIV neg prev per patient report, done in 2013. HCV prev neg.

## 2022-07-25 NOTE — Assessment & Plan Note (Signed)
Living will d/w pt. Mark Simon designated if patient were incapacitated.

## 2022-07-25 NOTE — Assessment & Plan Note (Signed)
Insomnia improved with ambien, but less effect with bigger meal.  D/w pt about dosing/meals.  No ADE on med.  Continue Ambien as needed.

## 2022-08-05 ENCOUNTER — Other Ambulatory Visit: Payer: Self-pay | Admitting: Family Medicine

## 2022-08-05 NOTE — Telephone Encounter (Signed)
Refill request for ALPRAZOLAM 0.25 MG TABLET  LOV - 07/23/22 Next OV - not scheduled Last refill - 06/06/22 #30/1

## 2022-09-09 ENCOUNTER — Other Ambulatory Visit: Payer: Self-pay | Admitting: Family Medicine

## 2022-09-09 NOTE — Telephone Encounter (Signed)
Refill request for ZOLPIDEM TARTRATE 10 MG TABLET  LOV - 07/23/22 Next OV - not scheduled Last refill - 07/09/22 #30/1

## 2022-10-11 ENCOUNTER — Other Ambulatory Visit: Payer: Self-pay | Admitting: Family Medicine

## 2022-10-12 NOTE — Telephone Encounter (Signed)
Refill request for ALPRAZOLAM 0.25 MG TABLET   LOV - 07/23/22 Next OV - not scheduled Last refill - 08/06/22 #30/1

## 2022-10-14 ENCOUNTER — Other Ambulatory Visit: Payer: Self-pay

## 2022-11-09 ENCOUNTER — Other Ambulatory Visit: Payer: Self-pay | Admitting: Family Medicine

## 2022-11-09 NOTE — Telephone Encounter (Signed)
Refill request for ZOLPIDEM TARTRATE 10 MG TABLET   LOV - 07/23/22 Next OV - not scheduled Last refill - 09/10/22 #30/1

## 2022-12-21 ENCOUNTER — Other Ambulatory Visit: Payer: Self-pay | Admitting: Family Medicine

## 2022-12-21 ENCOUNTER — Other Ambulatory Visit: Payer: Self-pay

## 2022-12-21 NOTE — Telephone Encounter (Signed)
Refill request for   amphetamine-dextroamphetamine (ADDERALL) 10 MG tablet    LOV - 07/18/22 Next OV - not scheduled Last refill - 07/18/22 #60/0 x 3

## 2023-01-09 ENCOUNTER — Other Ambulatory Visit: Payer: Self-pay | Admitting: Family Medicine

## 2023-01-10 NOTE — Telephone Encounter (Signed)
Refill request for ALPRAZOLAM 0.25 MG TABLET   LOV - 07/23/22 Next OV - not scheduled Last refill - 10/13/22 #30/2

## 2023-03-01 ENCOUNTER — Other Ambulatory Visit: Payer: Self-pay

## 2023-03-01 ENCOUNTER — Other Ambulatory Visit: Payer: Self-pay | Admitting: Family Medicine

## 2023-03-01 NOTE — Telephone Encounter (Signed)
Refill request for amphetamine-dextroamphetamine (ADDERALL) 10 MG tablet   LOV - 07/23/22 Next OV - not scheduled Last refill - 07/23/22 #60/0 x 3

## 2023-03-02 ENCOUNTER — Other Ambulatory Visit: Payer: Self-pay

## 2023-03-02 MED ORDER — AMPHETAMINE-DEXTROAMPHETAMINE 10 MG PO TABS
10.0000 mg | ORAL_TABLET | Freq: Two times a day (BID) | ORAL | 0 refills | Status: DC
  Filled 2023-03-02: qty 60, fill #0
  Filled 2023-04-25: qty 60, 30d supply, fill #0

## 2023-03-02 MED ORDER — AMPHETAMINE-DEXTROAMPHETAMINE 10 MG PO TABS
10.0000 mg | ORAL_TABLET | Freq: Two times a day (BID) | ORAL | 0 refills | Status: DC
  Filled 2023-03-02: qty 60, 30d supply, fill #0

## 2023-03-02 MED FILL — Amphetamine-Dextroamphetamine Tab 10 MG: ORAL | 30 days supply | Qty: 60 | Fill #0 | Status: CN

## 2023-03-03 ENCOUNTER — Other Ambulatory Visit: Payer: Self-pay | Admitting: Family Medicine

## 2023-03-07 NOTE — Telephone Encounter (Signed)
Refill request for ZOLPIDEM TARTRATE 10 MG TABLET   LOV - 07/23/22 Next OV - not scheduled Last refill - 11/10/22 #30/3

## 2023-04-04 ENCOUNTER — Other Ambulatory Visit: Payer: Self-pay | Admitting: Family Medicine

## 2023-04-05 NOTE — Telephone Encounter (Signed)
Refill request for ALPRAZOLAM 0.25 MG TABLET   LOV - 07/23/22 Next OV - 07/25/23 Last refill - 01/10/23 #30/2

## 2023-04-25 ENCOUNTER — Other Ambulatory Visit: Payer: Self-pay

## 2023-05-25 MED FILL — Amphetamine-Dextroamphetamine Tab 10 MG: ORAL | 30 days supply | Qty: 60 | Fill #0 | Status: AC

## 2023-05-26 ENCOUNTER — Other Ambulatory Visit: Payer: Self-pay

## 2023-06-01 ENCOUNTER — Other Ambulatory Visit: Payer: Self-pay

## 2023-06-22 ENCOUNTER — Other Ambulatory Visit: Payer: Self-pay

## 2023-06-22 ENCOUNTER — Ambulatory Visit: Payer: Self-pay | Attending: Family Medicine | Admitting: Pharmacist

## 2023-06-22 ENCOUNTER — Other Ambulatory Visit: Payer: Self-pay | Admitting: Family Medicine

## 2023-06-22 ENCOUNTER — Other Ambulatory Visit (HOSPITAL_COMMUNITY): Payer: Self-pay

## 2023-06-22 DIAGNOSIS — Z79899 Other long term (current) drug therapy: Secondary | ICD-10-CM

## 2023-06-22 MED ORDER — HUMIRA (2 PEN) 40 MG/0.4ML ~~LOC~~ AJKT: AUTO-INJECTOR | SUBCUTANEOUS | 6 refills | Status: DC

## 2023-06-22 MED ORDER — HUMIRA (2 PEN) 40 MG/0.4ML ~~LOC~~ AJKT
AUTO-INJECTOR | SUBCUTANEOUS | 6 refills | Status: DC
  Filled 2023-06-22: qty 2, fill #0

## 2023-06-22 NOTE — Progress Notes (Signed)
  S: Patient presents for review of their specialty medication therapy.  Patient is currently taking Humira for RA. Patient is managed by Dr. Allena Katz for this.   Adherence: confirms. Has been taking almost 2 years.  Efficacy: reports that the medication works well for him.  Dosing:  Rheumatoid arthritis: SubQ: 40 mg every other week (may continue methotrexate, other nonbiologic DMARDS, corticosteroids, NSAIDs, and/or analgesics); patients not taking concomitant methotrexate may increase dose to 40 mg every week  Dose adjustments: Renal: no dose adjustments (has not been studied) Hepatic: no dose adjustments (has not been studied)  Drug-drug interactions: none  Screening: TB test: completed  Hepatitis: completed   Monitoring: S/sx of infection: none  [id number] by Dr. Allena Katz S/sx of hypersensitivity: none  S/sx of malignancy: none  S/sx of heart failure: none   Other side effects:  -Some bruising at the injection site. Occasional nausea.  -Nothing contraindicative to care.   O:     Lab Results  Component Value Date   WBC 6.2 04/07/2021   HGB 14.8 04/07/2021   HCT 42.4 04/07/2021   MCV 94.0 04/07/2021   PLT 253.0 04/07/2021      Chemistry      Component Value Date/Time   NA 139 04/07/2021 1245   K 4.3 04/07/2021 1245   CL 103 04/07/2021 1245   CO2 28 04/07/2021 1245   BUN 10 04/07/2021 1245   CREATININE 0.94 04/07/2021 1245      Component Value Date/Time   CALCIUM 9.9 04/07/2021 1245   ALKPHOS 69 04/07/2021 1245   AST 17 04/07/2021 1245   AST 19 01/28/2015 0000   ALT 27 04/07/2021 1245   BILITOT 0.5 04/07/2021 1245       A/P: 1. Medication review: Patient currently on Humira for RA. Reviewed the medication with the patient, including the following: Humira is a TNF blocking agent indicated for ankylosing spondylitis, Crohn's disease, Hidradenitis suppurativa, psoriatic arthritis, plaque psoriasis, ulcerative colitis, and uveitis. Patient educated on  purpose, proper use and potential adverse effects of Humira. Possible adverse effects are increased risk of infections, headache, and injection site reactions. There is the possibility of an increased risk of malignancy but it is not well understood if this increased risk is due to there medication or the disease state. There are rare cases of pancytopenia and aplastic anemia. For SubQ injection at separate sites in the thigh or lower abdomen (avoiding areas within 2 inches of navel); rotate injection sites. May leave at room temperature for ~15 to 30 minutes prior to use; do not remove cap or cover while allowing product to reach room temperature. Do not use if solution is discolored or contains particulate matter. Do not administer to skin which is red, tender, bruised, hard, or that has scars, stretch marks, or psoriasis plaques. Needle cap of the prefilled syringe or needle cover for the adalimumab pen may contain latex. Prefilled pens and syringes are available for use by patients and the full amount of the syringe should be injected (self-administration); the vial is intended for institutional use only. Vials do not contain a preservative; discard unused portion. No recommendations for any changes at this time.  Butch Penny, PharmD, P[place]y [place], CPP Clinical Pharmacist [facility] [phone]

## 2023-06-23 ENCOUNTER — Other Ambulatory Visit: Payer: Self-pay

## 2023-06-23 ENCOUNTER — Other Ambulatory Visit: Payer: Self-pay | Admitting: Family Medicine

## 2023-06-23 NOTE — Telephone Encounter (Signed)
Refill request for   amphetamine-dextroamphetamine (ADDERALL) 10 MG tablet    LOV - 07/23/22 Next OV - 07/26/23 Last refill - 03/02/23 #60/0 x 3

## 2023-06-24 ENCOUNTER — Other Ambulatory Visit (HOSPITAL_COMMUNITY): Payer: Self-pay

## 2023-06-24 ENCOUNTER — Other Ambulatory Visit: Payer: Self-pay

## 2023-06-25 ENCOUNTER — Other Ambulatory Visit: Payer: Self-pay | Admitting: Family Medicine

## 2023-06-26 MED ORDER — AMPHETAMINE-DEXTROAMPHETAMINE 10 MG PO TABS
10.0000 mg | ORAL_TABLET | Freq: Two times a day (BID) | ORAL | 0 refills | Status: DC
  Filled 2023-06-26: qty 60, 30d supply, fill #0

## 2023-06-26 MED FILL — Amphetamine-Dextroamphetamine Tab 10 MG: ORAL | 30 days supply | Qty: 60 | Fill #0 | Status: CN

## 2023-06-26 NOTE — Telephone Encounter (Signed)
Sent. Thanks.   

## 2023-06-27 ENCOUNTER — Other Ambulatory Visit: Payer: Self-pay

## 2023-06-27 NOTE — Telephone Encounter (Signed)
Refill request for Alprazolam and Ambien   LOV - 07/23/22 Next OV - 07/26/23 Last refill - Alprazolam 04/06/23 #30/2                  Ambien 03/08/23 #30/3

## 2023-06-28 ENCOUNTER — Other Ambulatory Visit: Payer: Self-pay

## 2023-06-29 ENCOUNTER — Other Ambulatory Visit: Payer: Self-pay

## 2023-06-30 ENCOUNTER — Other Ambulatory Visit (HOSPITAL_COMMUNITY): Payer: Self-pay

## 2023-07-01 ENCOUNTER — Other Ambulatory Visit (HOSPITAL_COMMUNITY): Payer: Self-pay

## 2023-07-04 ENCOUNTER — Other Ambulatory Visit: Payer: Self-pay

## 2023-07-04 ENCOUNTER — Other Ambulatory Visit: Payer: Self-pay | Admitting: Internal Medicine

## 2023-07-04 MED ORDER — HUMIRA (2 PEN) 40 MG/0.4ML ~~LOC~~ AJKT: AUTO-INJECTOR | SUBCUTANEOUS | 1 refills | Status: DC

## 2023-07-05 ENCOUNTER — Other Ambulatory Visit: Payer: Self-pay | Admitting: Pharmacist

## 2023-07-05 ENCOUNTER — Other Ambulatory Visit (HOSPITAL_COMMUNITY): Payer: Self-pay

## 2023-07-05 ENCOUNTER — Other Ambulatory Visit: Payer: Self-pay

## 2023-07-05 MED ORDER — HUMIRA (2 PEN) 40 MG/0.8ML ~~LOC~~ AJKT
AUTO-INJECTOR | SUBCUTANEOUS | 1 refills | Status: DC
Start: 2023-07-05 — End: 2023-07-05

## 2023-07-05 MED ORDER — HUMIRA (2 PEN) 40 MG/0.8ML ~~LOC~~ AJKT
AUTO-INJECTOR | SUBCUTANEOUS | 1 refills | Status: AC
  Filled 2023-07-05 – 2023-07-06 (×4): qty 1.6, 28d supply, fill #0
  Filled 2023-07-22: qty 1.6, 28d supply, fill #1
  Filled 2023-08-19: qty 1.6, 28d supply, fill #2
  Filled 2023-09-13: qty 1.6, 28d supply, fill #3
  Filled 2023-10-17: qty 1.6, 28d supply, fill #4

## 2023-07-05 MED ORDER — HUMIRA (2 PEN) 40 MG/0.4ML ~~LOC~~ AJKT: AUTO-INJECTOR | SUBCUTANEOUS | 1 refills | Status: DC

## 2023-07-06 ENCOUNTER — Other Ambulatory Visit: Payer: Self-pay

## 2023-07-06 ENCOUNTER — Encounter (INDEPENDENT_AMBULATORY_CARE_PROVIDER_SITE_OTHER): Payer: Self-pay

## 2023-07-08 ENCOUNTER — Other Ambulatory Visit (HOSPITAL_COMMUNITY): Payer: Self-pay

## 2023-07-10 ENCOUNTER — Other Ambulatory Visit: Payer: Self-pay

## 2023-07-18 ENCOUNTER — Other Ambulatory Visit: Payer: 59

## 2023-07-22 ENCOUNTER — Other Ambulatory Visit (HOSPITAL_COMMUNITY): Payer: Self-pay

## 2023-07-25 ENCOUNTER — Encounter: Payer: 59 | Admitting: Family Medicine

## 2023-07-26 ENCOUNTER — Encounter: Payer: Self-pay | Admitting: Family Medicine

## 2023-07-26 ENCOUNTER — Ambulatory Visit (INDEPENDENT_AMBULATORY_CARE_PROVIDER_SITE_OTHER): Payer: 59 | Admitting: Family Medicine

## 2023-07-26 VITALS — BP 118/80 | HR 83 | Temp 97.6°F | Ht 68.1 in | Wt 188.0 lb

## 2023-07-26 DIAGNOSIS — Z125 Encounter for screening for malignant neoplasm of prostate: Secondary | ICD-10-CM

## 2023-07-26 DIAGNOSIS — Z7189 Other specified counseling: Secondary | ICD-10-CM

## 2023-07-26 DIAGNOSIS — R41840 Attention and concentration deficit: Secondary | ICD-10-CM

## 2023-07-26 DIAGNOSIS — E785 Hyperlipidemia, unspecified: Secondary | ICD-10-CM | POA: Diagnosis not present

## 2023-07-26 DIAGNOSIS — R5383 Other fatigue: Secondary | ICD-10-CM

## 2023-07-26 DIAGNOSIS — M069 Rheumatoid arthritis, unspecified: Secondary | ICD-10-CM

## 2023-07-26 DIAGNOSIS — Z0000 Encounter for general adult medical examination without abnormal findings: Secondary | ICD-10-CM | POA: Diagnosis not present

## 2023-07-26 DIAGNOSIS — G4700 Insomnia, unspecified: Secondary | ICD-10-CM

## 2023-07-26 LAB — CBC WITH DIFFERENTIAL/PLATELET
Basophils Absolute: 0.1 10*3/uL (ref 0.0–0.1)
Basophils Relative: 1.1 % (ref 0.0–3.0)
Eosinophils Absolute: 0.5 10*3/uL (ref 0.0–0.7)
Eosinophils Relative: 8.8 % — ABNORMAL HIGH (ref 0.0–5.0)
HCT: 43.3 % (ref 39.0–52.0)
Hemoglobin: 14.5 g/dL (ref 13.0–17.0)
Lymphocytes Relative: 39.5 % (ref 12.0–46.0)
Lymphs Abs: 2 10*3/uL (ref 0.7–4.0)
MCHC: 33.6 g/dL (ref 30.0–36.0)
MCV: 95.9 fl (ref 78.0–100.0)
Monocytes Absolute: 0.4 10*3/uL (ref 0.1–1.0)
Monocytes Relative: 7.1 % (ref 3.0–12.0)
Neutro Abs: 2.2 10*3/uL (ref 1.4–7.7)
Neutrophils Relative %: 43.5 % (ref 43.0–77.0)
Platelets: 230 10*3/uL (ref 150.0–400.0)
RBC: 4.51 Mil/uL (ref 4.22–5.81)
RDW: 12.4 % (ref 11.5–15.5)
WBC: 5.2 10*3/uL (ref 4.0–10.5)

## 2023-07-26 LAB — COMPREHENSIVE METABOLIC PANEL
ALT: 20 U/L (ref 0–53)
AST: 15 U/L (ref 0–37)
Albumin: 4.4 g/dL (ref 3.5–5.2)
Alkaline Phosphatase: 57 U/L (ref 39–117)
BUN: 9 mg/dL (ref 6–23)
CO2: 28 mEq/L (ref 19–32)
Calcium: 9.1 mg/dL (ref 8.4–10.5)
Chloride: 104 mEq/L (ref 96–112)
Creatinine, Ser: 0.79 mg/dL (ref 0.40–1.50)
GFR: 97.77 mL/min (ref >60.00)
Glucose, Bld: 110 mg/dL — ABNORMAL HIGH (ref 70–99)
Potassium: 4.1 mEq/L (ref 3.5–5.1)
Sodium: 139 mEq/L (ref 135–145)
Total Bilirubin: 0.6 mg/dL (ref 0.2–1.2)
Total Protein: 6.7 g/dL (ref 6.0–8.3)

## 2023-07-26 LAB — LIPID PANEL
Cholesterol: 189 mg/dL (ref 0–200)
HDL: 57.2 mg/dL (ref >39.00)
LDL Cholesterol: 116 mg/dL — ABNORMAL HIGH (ref 0–99)
NonHDL: 131.65
Total CHOL/HDL Ratio: 3
Triglycerides: 77 mg/dL (ref 0.0–149.0)
VLDL: 15.4 mg/dL (ref 0.0–40.0)

## 2023-07-26 LAB — SEDIMENTATION RATE: Sed Rate: 3 mm/hr (ref 0–20)

## 2023-07-26 LAB — TSH: TSH: 0.73 u[IU]/mL (ref 0.35–5.50)

## 2023-07-26 LAB — PSA: PSA: 0.67 ng/mL (ref 0.10–4.00)

## 2023-07-26 NOTE — Progress Notes (Unsigned)
CPE- See plan.  Routine anticipatory guidance given to patient.  See health maintenance.  The possibility exists that previously documented standard health maintenance information may have been brought forward from a previous encounter into this note.  If needed, that same information has been updated to reflect the current situation based on today's encounter.    Tetanus 2018 Flu encouraged PNA not due. Shingles d/w pt covid vaccine prev done.   PSA pending 2024.  FH noted.   Colonoscopy 2017 Living will d/w pt. [name] designated if patient were incapacitated. Diet and exercise d/w pt.   HIV neg prev per patient report, done in 2013. HCV prev neg.    Vomited once recently, isolated event.  No abd pain.  No fevers, no chills.  No blood in urine or stool.  No CP.  He can update me if he has continued symptoms.  ADD.  Still on adderall at baseline. He had been taking it daily in the AM usually.  It still helps some.    Insomnia.  Sleep is disrupted, trouble getting to sleep.  Had been taking ambien at baseline.    RA per outside clinic.  On methotrexate and humira.  Has f/u pending.   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  Working 4 days with 10 hours shifts, working later at night and some swing shifts.  The job is emotionally and physically draining given the relative social isolation and demands with lifting.  Fatigued.  He was hoping the job change would allow him to be more active but according to patient the job change may have been counterproductive.  PMH and SH reviewed  Meds, vitals, and allergies reviewed.   ROS: Per HPI.  Unless specifically indicated otherwise in HPI, the patient denies:  General: fever. Eyes: acute vision changes ENT: sore throat Cardiovascular: chest pain Respiratory: SOB GI: vomiting GU: dysuria Musculoskeletal: acute back pain Derm: acute rash Neuro: acute motor dysfunction Psych: worsening mood Endocrine: polydipsia Heme:  bleeding Allergy: hayfever  GEN: nad, alert and oriented HEENT: mucous membranes moist NECK: supple w/o LA CV: rrr. PULM: ctab, no inc wob ABD: soft, +bs EXT: no edema SKIN: no acute rash

## 2023-07-26 NOTE — Patient Instructions (Addendum)
Go to the lab on the way out.   If you have mychart we'll likely use that to update you.    Take care.  Glad to see you. I would get a flu shot each fall.   

## 2023-07-27 DIAGNOSIS — R5383 Other fatigue: Secondary | ICD-10-CM | POA: Insufficient documentation

## 2023-07-27 NOTE — Assessment & Plan Note (Signed)
Still on adderall at baseline. He had been taking it daily in the AM usually.  It still helps some.   Would continue as is.

## 2023-07-27 NOTE — Assessment & Plan Note (Signed)
Tetanus 2018 Flu encouraged PNA not due. Shingles d/w pt covid vaccine prev done.   PSA pending 2024.  FH noted.   Colonoscopy 2017 Living will d/w pt. [name] designated if patient were incapacitated. Diet and exercise d/w pt.   HIV neg prev per patient report, done in 2013. HCV prev neg.

## 2023-07-27 NOTE — Assessment & Plan Note (Signed)
Unclear how much this is exacerbated by or related to sleep disruption, job change, his current work environment/demands, or some combination of the above.  He is considering options.  Okay for outpatient follow-up.  I think it makes sense to check his labs to look for reversible causes.  Will also send a copy of his laboratory results to rheumatology as FYI, so that we can try to limit lab draws.  See notes on labs.

## 2023-07-27 NOTE — Assessment & Plan Note (Signed)
Living will d/w pt. Mark Simon designated if patient were incapacitated. 

## 2023-07-27 NOTE — Assessment & Plan Note (Signed)
On methotrexate and humira.  Has f/u pending.

## 2023-07-27 NOTE — Assessment & Plan Note (Signed)
Sleep is disrupted, trouble getting to sleep.  Had been taking ambien at baseline.

## 2023-07-29 ENCOUNTER — Other Ambulatory Visit (HOSPITAL_COMMUNITY): Payer: Self-pay

## 2023-08-02 DIAGNOSIS — H04123 Dry eye syndrome of bilateral lacrimal glands: Secondary | ICD-10-CM | POA: Diagnosis not present

## 2023-08-02 DIAGNOSIS — H5213 Myopia, bilateral: Secondary | ICD-10-CM | POA: Diagnosis not present

## 2023-08-03 DIAGNOSIS — M0579 Rheumatoid arthritis with rheumatoid factor of multiple sites without organ or systems involvement: Secondary | ICD-10-CM | POA: Diagnosis not present

## 2023-08-03 DIAGNOSIS — Z79899 Other long term (current) drug therapy: Secondary | ICD-10-CM | POA: Diagnosis not present

## 2023-08-19 ENCOUNTER — Other Ambulatory Visit (HOSPITAL_COMMUNITY): Payer: Self-pay

## 2023-08-23 ENCOUNTER — Other Ambulatory Visit: Payer: Self-pay

## 2023-08-23 MED FILL — Amphetamine-Dextroamphetamine Tab 10 MG: ORAL | 30 days supply | Qty: 60 | Fill #0 | Status: CN

## 2023-08-25 ENCOUNTER — Other Ambulatory Visit (HOSPITAL_COMMUNITY): Payer: Self-pay

## 2023-08-27 ENCOUNTER — Encounter (HOSPITAL_COMMUNITY): Payer: Self-pay

## 2023-08-29 ENCOUNTER — Other Ambulatory Visit: Payer: Self-pay

## 2023-08-29 MED FILL — Amphetamine-Dextroamphetamine Tab 10 MG: ORAL | 30 days supply | Qty: 60 | Fill #0 | Status: AC

## 2023-09-13 ENCOUNTER — Other Ambulatory Visit: Payer: Self-pay

## 2023-09-13 ENCOUNTER — Other Ambulatory Visit (HOSPITAL_COMMUNITY): Payer: Self-pay

## 2023-09-13 NOTE — Progress Notes (Signed)
Specialty Pharmacy Refill Coordination Note  Mark Simon is a 57 y.o. male contacted today regarding refills of specialty medication(s) Adalimumab   Patient requested Delivery   Delivery date: 09/22/23   Verified address: [address] [place] [address]   Medication will be filled on 09/21/23.

## 2023-10-17 ENCOUNTER — Other Ambulatory Visit: Payer: Self-pay

## 2023-10-17 NOTE — Progress Notes (Signed)
Specialty Pharmacy Refill Coordination Note  Mark Simon is a 57 y.o. male contacted today regarding refills of specialty medication(s) Adalimumab   Patient requested Delivery   Delivery date: 10/21/23   Verified address: [address] [place], [place] [address]   Medication will be filled on 10/20/23.

## 2023-10-20 ENCOUNTER — Other Ambulatory Visit: Payer: Self-pay

## 2023-10-20 NOTE — Progress Notes (Signed)
Patient has new insurance and must fill with [facility]. Patient is aware and has already coordinated with his office to have prescription sent. Disenrolling.

## 2023-10-25 ENCOUNTER — Other Ambulatory Visit: Payer: Self-pay | Admitting: Family Medicine

## 2023-10-26 NOTE — Telephone Encounter (Signed)
Last office visit: 07/26/23 Next office visit: nothing scheduled Last refill:  ALPRAZOLAM 0.25 MG TABLET (06/28/23) 30 tablets with 3 refills ZOLPIDEM TARTRATE 10 MG TABLET(06/28/23) 30 tablets with 3 refills

## 2023-10-26 NOTE — Telephone Encounter (Signed)
Sent. Thanks.   

## 2023-11-01 ENCOUNTER — Other Ambulatory Visit: Payer: Self-pay | Admitting: Family Medicine

## 2023-11-01 ENCOUNTER — Other Ambulatory Visit: Payer: Self-pay

## 2023-11-01 NOTE — Telephone Encounter (Signed)
amphetamine-dextroamphetamine (ADDERALL) 10 MG tablet  Take 1 tablet (10 mg total) by mouth 2 (two) times daily. last app: CPE 07/26/23 follow up: N/A last fill: 07/27/23 #60 no rf

## 2023-11-02 ENCOUNTER — Other Ambulatory Visit: Payer: Self-pay

## 2023-11-02 MED ORDER — AMPHETAMINE-DEXTROAMPHETAMINE 10 MG PO TABS
10.0000 mg | ORAL_TABLET | Freq: Two times a day (BID) | ORAL | 0 refills | Status: DC
  Filled 2023-11-02: qty 60, 30d supply, fill #0

## 2023-11-02 MED ORDER — AMPHETAMINE-DEXTROAMPHETAMINE 10 MG PO TABS
10.0000 mg | ORAL_TABLET | Freq: Two times a day (BID) | ORAL | 0 refills | Status: DC
  Filled 2023-11-02 – 2024-02-14 (×3): qty 60, 30d supply, fill #0

## 2023-11-02 MED FILL — Amphetamine-Dextroamphetamine Tab 10 MG: ORAL | 30 days supply | Qty: 60 | Fill #0 | Status: CN

## 2023-11-02 NOTE — Telephone Encounter (Signed)
Duplicate request; please deny once 1st is approved.

## 2023-11-02 NOTE — Telephone Encounter (Signed)
Rx updated and sent.

## 2023-12-26 ENCOUNTER — Other Ambulatory Visit: Payer: Self-pay

## 2023-12-26 MED FILL — Amphetamine-Dextroamphetamine Tab 10 MG: ORAL | 30 days supply | Qty: 60 | Fill #0 | Status: AC

## 2024-02-02 ENCOUNTER — Other Ambulatory Visit: Payer: Self-pay

## 2024-02-06 ENCOUNTER — Other Ambulatory Visit (HOSPITAL_COMMUNITY): Payer: Self-pay

## 2024-02-06 ENCOUNTER — Telehealth: Payer: Self-pay

## 2024-02-06 NOTE — Telephone Encounter (Signed)
 Pharmacy Patient Advocate Encounter  Received notification from [facility] that Prior Authorization for Amphetamine-Dextroamphetamine 10MG  tablets has been APPROVED from 02/02/2024 to 02/01/2025   PA #/Case ID/Reference #:[id number] (Key: [id number]) Pharmacy Patient Advocate Encounter   Ran test claim for Amphetamine-Dextroamphetamine 10MG  tablets . Currently a quantity of 60 is a 30 day supply and the co-pay is 0.00.   This test claim was processed through [facility]- copay amounts may vary at other pharmacies due to pharmacy/plan contracts, or as the patient moves through the different stages of their insurance plan.

## 2024-02-14 ENCOUNTER — Other Ambulatory Visit: Payer: Self-pay

## 2024-02-21 ENCOUNTER — Other Ambulatory Visit: Payer: Self-pay | Admitting: Family Medicine

## 2024-02-21 NOTE — Telephone Encounter (Signed)
 Sent. Thanks.

## 2024-04-18 ENCOUNTER — Other Ambulatory Visit: Payer: Self-pay | Admitting: Family Medicine

## 2024-04-18 MED ORDER — AMPHETAMINE-DEXTROAMPHETAMINE 10 MG PO TABS
10.0000 mg | ORAL_TABLET | Freq: Two times a day (BID) | ORAL | 0 refills | Status: DC
Start: 2024-04-18 — End: 2024-07-26
  Filled 2024-04-18: qty 60, 30d supply, fill #0

## 2024-04-18 MED ORDER — AMPHETAMINE-DEXTROAMPHETAMINE 10 MG PO TABS
10.0000 mg | ORAL_TABLET | Freq: Two times a day (BID) | ORAL | 0 refills | Status: DC
  Filled 2024-04-18 – 2024-06-07 (×2): qty 60, 30d supply, fill #0

## 2024-04-18 MED ORDER — AMPHETAMINE-DEXTROAMPHETAMINE 10 MG PO TABS
10.0000 mg | ORAL_TABLET | Freq: Two times a day (BID) | ORAL | 0 refills | Status: DC
  Filled 2024-04-18: qty 60, 30d supply, fill #0

## 2024-04-19 ENCOUNTER — Other Ambulatory Visit: Payer: Self-pay

## 2024-06-07 ENCOUNTER — Other Ambulatory Visit: Payer: Self-pay

## 2024-06-12 ENCOUNTER — Other Ambulatory Visit (HOSPITAL_COMMUNITY): Payer: Self-pay

## 2024-06-27 ENCOUNTER — Other Ambulatory Visit: Payer: Self-pay | Admitting: Family Medicine

## 2024-06-27 NOTE — Telephone Encounter (Signed)
 LOV: 07/26/23 NOV: 07/26/24 Last Refill: ALPRAZolam  (XANAX ) 0.25 MG tablet  02/21/24 30 TABLETS 3 REFILLS zolpidem (AMBIEN) 10 MG tablet  02/21/24 30 TABLETS 3 REFILLS

## 2024-07-01 ENCOUNTER — Encounter: Payer: Self-pay | Admitting: Family Medicine

## 2024-07-26 ENCOUNTER — Ambulatory Visit (INDEPENDENT_AMBULATORY_CARE_PROVIDER_SITE_OTHER): Admitting: Family Medicine

## 2024-07-26 ENCOUNTER — Encounter: Payer: Self-pay | Admitting: Family Medicine

## 2024-07-26 VITALS — BP 128/84 | HR 86 | Temp 98.4°F | Ht 69.37 in | Wt 185.2 lb

## 2024-07-26 DIAGNOSIS — E785 Hyperlipidemia, unspecified: Secondary | ICD-10-CM

## 2024-07-26 DIAGNOSIS — Z7189 Other specified counseling: Secondary | ICD-10-CM

## 2024-07-26 DIAGNOSIS — R41840 Attention and concentration deficit: Secondary | ICD-10-CM

## 2024-07-26 DIAGNOSIS — G4700 Insomnia, unspecified: Secondary | ICD-10-CM

## 2024-07-26 DIAGNOSIS — M069 Rheumatoid arthritis, unspecified: Secondary | ICD-10-CM

## 2024-07-26 DIAGNOSIS — Z125 Encounter for screening for malignant neoplasm of prostate: Secondary | ICD-10-CM | POA: Diagnosis not present

## 2024-07-26 DIAGNOSIS — F419 Anxiety disorder, unspecified: Secondary | ICD-10-CM

## 2024-07-26 DIAGNOSIS — Z0000 Encounter for general adult medical examination without abnormal findings: Secondary | ICD-10-CM | POA: Diagnosis not present

## 2024-07-26 LAB — LIPID PANEL
Cholesterol: 194 mg/dL (ref 0–200)
HDL: 64.4 mg/dL (ref >39.00)
LDL Cholesterol: 106 mg/dL — ABNORMAL HIGH (ref 0–99)
NonHDL: 129.82
Total CHOL/HDL Ratio: 3
Triglycerides: 119 mg/dL (ref 0.0–149.0)
VLDL: 23.8 mg/dL (ref 0.0–40.0)

## 2024-07-26 LAB — PSA: PSA: 0.87 ng/mL (ref 0.10–4.00)

## 2024-07-26 LAB — GLUCOSE, RANDOM: Glucose, Bld: 108 mg/dL — ABNORMAL HIGH (ref 70–99)

## 2024-07-26 MED ORDER — AMPHETAMINE-DEXTROAMPHETAMINE 10 MG PO TABS: 10.0000 mg | ORAL_TABLET | Freq: Two times a day (BID) | ORAL | 0 refills | Status: AC

## 2024-07-26 NOTE — Patient Instructions (Signed)
 I would get a flu shot each fall.   Take care.  Glad to see you. Go to the lab on the way out.   If you have mychart we'll likely use that to update you.

## 2024-07-26 NOTE — Progress Notes (Signed)
 CPE- See plan.  Routine anticipatory guidance given to patient.  See health maintenance.  The possibility exists that previously documented standard health maintenance information may have been brought forward from a previous encounter into this note.  If needed, that same information has been updated to reflect the current situation based on today's encounter.    Tetanus 2018 Flu encouraged PNA d/w pt.   Shingles d/w pt. covid vaccine prev done.   PSA pending 2024.  FH noted.   Colonoscopy 2017 Living will d/w pt. Adriana Pouch designated if patient were incapacitated. Diet and exercise d/w pt.   HIV neg prev per patient report, done in 2013. HCV prev neg.    Labs pending.   Anxiety.  Used xanax  prn, ie prior to a meeting or higher stress event.  No ADE on med.   Concentration difficulty.  Still on adderall at baseline. He had been taking it daily in the AM usually.  It still helps some with task completion.  He noted changes with days with skipped med.     Insomnia at baseline.  Had been taking ambien at baseline.  D/w pt about sleep hygiene.  Working 1st shift.  No ADE on med.     RA per outside clinic.  On methotrexate  and humira .  Has f/u pending.  He had trouble getting humira  coverage with job change.  He is back on his baseline med now and that helped.  L knee pain is improving.    He is back at the vet clinic.  D/w pt.    PMH and SH reviewed  Meds, vitals, and allergies reviewed.   ROS: Per HPI.  Unless specifically indicated otherwise in HPI, the patient denies:  General: fever. Eyes: acute vision changes ENT: sore throat Cardiovascular: chest pain Respiratory: SOB GI: vomiting GU: dysuria Musculoskeletal: acute back pain Derm: acute rash Neuro: acute motor dysfunction Psych: worsening mood Endocrine: polydipsia Heme: bleeding Allergy: hayfever  GEN: nad, alert and oriented HEENT: mucous membranes moist NECK: supple w/o LA CV: rrr. PULM: ctab, no inc  wob ABD: soft, +bs EXT: no edema SKIN: well perfused

## 2024-07-29 ENCOUNTER — Ambulatory Visit: Payer: Self-pay | Admitting: Family Medicine

## 2024-07-29 NOTE — Assessment & Plan Note (Signed)
 Tetanus 2018 Flu encouraged PNA d/w pt.   Shingles d/w pt. covid vaccine prev done.   PSA pending 2024.  FH noted.   Colonoscopy 2017 Living will d/w pt. Adriana Pouch designated if patient were incapacitated. Diet and exercise d/w pt.   HIV neg prev per patient report, done in 2013. HCV prev neg.

## 2024-07-29 NOTE — Assessment & Plan Note (Signed)
 Continue ambien prn.  Update me as needed.  No ADE on med.

## 2024-07-29 NOTE — Assessment & Plan Note (Signed)
 Used xanax  prn, ie prior to a meeting or higher stress event.  No ADE on med. Continue as is.

## 2024-07-29 NOTE — Assessment & Plan Note (Signed)
Living will d/w pt. Mark Simon designated if patient were incapacitated. 

## 2024-07-29 NOTE — Assessment & Plan Note (Signed)
 Still on adderall at baseline. He had been taking it daily in the AM usually.  It still helps some with task completion.  He noted changes with days with skipped med.  Continue as is.

## 2024-07-29 NOTE — Assessment & Plan Note (Signed)
 RA per outside clinic.

## 2024-10-24 ENCOUNTER — Other Ambulatory Visit: Payer: Self-pay | Admitting: Family Medicine

## 2024-10-24 DIAGNOSIS — F419 Anxiety disorder, unspecified: Secondary | ICD-10-CM

## 2024-10-24 DIAGNOSIS — G4700 Insomnia, unspecified: Secondary | ICD-10-CM

## 2024-10-25 NOTE — Telephone Encounter (Signed)
 Name of Medication:  Alprazolam , Ambien Name of [facility]acy[facility]  [facility] or Written Date and Quantity:  09/26/24, #30 Last Office Visit and Type:  07/26/24, CPE Next Office Visit and Type:  none Last Controlled Substance Agreement Date: 05/06/17 Last UDS:  05/06/17

## 2024-10-26 NOTE — Telephone Encounter (Signed)
 Sent. Thanks.

## 2025-01-08 ENCOUNTER — Ambulatory Visit: Payer: Self-pay

## 2025-01-08 ENCOUNTER — Ambulatory Visit: Admitting: Family Medicine

## 2025-01-08 ENCOUNTER — Telehealth: Payer: Self-pay

## 2025-01-08 ENCOUNTER — Encounter: Payer: Self-pay | Admitting: Family Medicine

## 2025-01-08 VITALS — BP 142/102 | HR 76 | Temp 98.6°F | Ht 69.0 in | Wt 195.0 lb

## 2025-01-08 DIAGNOSIS — R1011 Right upper quadrant pain: Secondary | ICD-10-CM | POA: Diagnosis not present

## 2025-01-08 NOTE — Telephone Encounter (Signed)
"  See OV note.  "

## 2025-01-08 NOTE — Telephone Encounter (Signed)
 Can you please change the imaging site to [facility]?

## 2025-01-08 NOTE — Patient Instructions (Signed)
 Go to the lab on the way out.   If you have mychart we'll likely use that to update you.    Let me know if you don't get a call about the ultrasound.  Limit fatty foods.  Take fluids and small amounts of solids.   If significantly worse, then go to the ER.   Take care.  Glad to see you.

## 2025-01-08 NOTE — Progress Notes (Unsigned)
 Noted twinge of discomfort under the R lower ribs.  Mild sx initially.  Then over the next few weeks, had more sx.  Always in the same area.  More pain a few days ago.  Took ibuprofen and felt better for a few days.  Relative rest for a few days but then had more discomfort.  R side feels different to patient compared to L side.    Inc in frequency, persistence, severity of the last month.  No L sided pain.  No vomiting, occ diarrhea at baseline.  No blood in stool.  No jaundice now but a coworker asked about that a few weeks ago.    No burning with urination, no blood in urine, no urinary sx.    More RUQ pain with movement.    Meds, vitals, and allergies reviewed.   ROS: Per HPI unless specifically indicated in ROS section   RUQ mildly ttp w/o rebound.

## 2025-01-08 NOTE — Telephone Encounter (Signed)
 FYI Only or Action Required?: FYI only for provider: appointment scheduled on 2/3.  Patient was last seen in primary care on 07/26/2024 by Cleatus Arlyss RAMAN, MD.  Called Nurse Triage reporting Abdominal Pain.  Symptoms began several weeks ago.  Interventions attempted: Nothing.  Symptoms are: gradually worsening.  Triage Disposition: See Physician Within 24 Hours  Patient/caregiver understands and will follow disposition?: Yes      Reason for Triage: started 3 weeks ago, pain under ribs, tender, sometimes swollen, hard to breath.  Reason for Disposition  [1] MODERATE pain (e.g., interferes with normal activities) AND [2] pain comes and goes (cramps) AND [3] present > 24 hours  (Exception: Pain with Vomiting or Diarrhea - see that Guideline.)  Answer Assessment - Initial Assessment Questions 1. LOCATION: Where does it hurt?      RUQ just under my ribs, pt unsure if he feels a bump under his ribcage  2. RADIATION: Does the pain shoot anywhere else? (e.g., chest, back)     Denies  3. ONSET: When did the pain begin? (Minutes, hours or days ago)      3-4 weeks  4. SUDDEN: Gradual or sudden onset?     Gradual  5. PATTERN Does the pain come and go, or is it constant?     Intermittent  6. SEVERITY: How bad is the pain?  (e.g., Scale 1-10; mild, moderate, or severe)     Hurts with pressure or with stretching  7. RECURRENT SYMPTOM: Have you ever had this type of stomach pain before? If Yes, ask: When was the last time? and What happened that time?      Denies  8. CAUSE: What do you think is causing the stomach pain? (e.g., gallstones, recent abdominal surgery)     An orange sized bump under his right ribcage  9. RELIEVING/AGGRAVATING FACTORS: What makes it better or worse? (e.g., antacids, bending or twisting motion, bowel movement)     Denies  10. OTHER SYMPTOMS: Do you have any other symptoms? (e.g., back pain, diarrhea, fever, urination pain,  vomiting)       Denies  Protocols used: Abdominal Pain - Male-A-AH

## 2025-01-09 ENCOUNTER — Ambulatory Visit: Admission: RE | Admit: 2025-01-09 | Discharge: 2025-01-09 | Attending: Family Medicine | Admitting: Family Medicine

## 2025-01-09 DIAGNOSIS — R1011 Right upper quadrant pain: Secondary | ICD-10-CM

## 2025-01-09 LAB — COMPREHENSIVE METABOLIC PANEL WITH GFR
ALT: 30 U/L (ref 3–53)
AST: 20 U/L (ref 5–37)
Albumin: 4.7 g/dL (ref 3.5–5.2)
Alkaline Phosphatase: 49 U/L (ref 39–117)
BUN: 7 mg/dL (ref 6–23)
CO2: 28 meq/L (ref 19–32)
Calcium: 9.5 mg/dL (ref 8.4–10.5)
Chloride: 102 meq/L (ref 96–112)
Creatinine, Ser: 0.84 mg/dL (ref 0.40–1.50)
GFR: 94.99 mL/min
Glucose, Bld: 98 mg/dL (ref 70–99)
Potassium: 4.1 meq/L (ref 3.5–5.1)
Sodium: 137 meq/L (ref 135–145)
Total Bilirubin: 0.6 mg/dL (ref 0.2–1.2)
Total Protein: 7.3 g/dL (ref 6.0–8.3)

## 2025-01-09 LAB — LIPASE: Lipase: 19 U/L (ref 11.0–59.0)

## 2025-01-09 LAB — CBC WITH DIFFERENTIAL/PLATELET
Basophils Absolute: 0.1 10*3/uL (ref 0.0–0.1)
Basophils Relative: 1.6 % (ref 0.0–3.0)
Eosinophils Absolute: 0.2 10*3/uL (ref 0.0–0.7)
Eosinophils Relative: 2.4 % (ref 0.0–5.0)
HCT: 44.3 % (ref 39.0–52.0)
Hemoglobin: 15.3 g/dL (ref 13.0–17.0)
Lymphocytes Relative: 38.4 % (ref 12.0–46.0)
Lymphs Abs: 3 10*3/uL (ref 0.7–4.0)
MCHC: 34.5 g/dL (ref 30.0–36.0)
MCV: 96.4 fl (ref 78.0–100.0)
Monocytes Absolute: 0.4 10*3/uL (ref 0.1–1.0)
Monocytes Relative: 5.1 % (ref 3.0–12.0)
Neutro Abs: 4.1 10*3/uL (ref 1.4–7.7)
Neutrophils Relative %: 52.5 % (ref 43.0–77.0)
Platelets: 226 10*3/uL (ref 150.0–400.0)
RBC: 4.59 Mil/uL (ref 4.22–5.81)
RDW: 12.5 % (ref 11.5–15.5)
WBC: 7.9 10*3/uL (ref 4.0–10.5)

## 2025-01-09 NOTE — Assessment & Plan Note (Signed)
 At this point still okay for outpatient follow-up.  Routine/ER cautions given to patient.  Discussed potential gallbladder evaluation.  See notes on labs.  Ultrasound ordered.  Rationale for workup discussed with patient. Limit fatty foods.  Take fluids and small amounts of solids.   If significantly worse, then go to the ER.

## 2025-01-10 ENCOUNTER — Ambulatory Visit: Payer: Self-pay | Admitting: Family Medicine

## 2025-01-15 ENCOUNTER — Other Ambulatory Visit
